# Patient Record
Sex: Male | Born: 1935 | Race: White | Marital: Married | Smoking: Former smoker
Health system: Southern US, Community
[De-identification: ages and names within clinical notes are randomized; demographics above are authoritative.]

## PROBLEM LIST (undated history)

## (undated) DIAGNOSIS — F329 Major depressive disorder, single episode, unspecified: Secondary | ICD-10-CM

## (undated) DIAGNOSIS — G4700 Insomnia, unspecified: Secondary | ICD-10-CM

## (undated) DIAGNOSIS — E039 Hypothyroidism, unspecified: Secondary | ICD-10-CM

## (undated) DIAGNOSIS — G309 Alzheimer's disease, unspecified: Principal | ICD-10-CM

## (undated) DIAGNOSIS — F0280 Dementia in other diseases classified elsewhere without behavioral disturbance: Secondary | ICD-10-CM

## (undated) DIAGNOSIS — H9190 Unspecified hearing loss, unspecified ear: Secondary | ICD-10-CM

## (undated) DIAGNOSIS — E079 Disorder of thyroid, unspecified: Secondary | ICD-10-CM

## (undated) DIAGNOSIS — F32A Depression, unspecified: Secondary | ICD-10-CM

## (undated) HISTORY — DX: Depression, unspecified: F32.A

## (undated) HISTORY — DX: Unspecified hearing loss, unspecified ear: H91.90

## (undated) HISTORY — PX: APPENDECTOMY: SHX54

## (undated) HISTORY — DX: Insomnia, unspecified: G47.00

## (undated) HISTORY — DX: Major depressive disorder, single episode, unspecified: F32.9

## (undated) HISTORY — DX: Hypothyroidism, unspecified: E03.9

## (undated) HISTORY — DX: Disorder of thyroid, unspecified: E07.9

## (undated) HISTORY — DX: Alzheimer's disease, unspecified: G30.9

## (undated) HISTORY — DX: Dementia in other diseases classified elsewhere, unspecified severity, without behavioral disturbance, psychotic disturbance, mood disturbance, and anxiety: F02.80

---

## 2010-11-09 DIAGNOSIS — F0280 Dementia in other diseases classified elsewhere without behavioral disturbance: Secondary | ICD-10-CM | POA: Insufficient documentation

## 2010-11-09 DIAGNOSIS — F32A Depression, unspecified: Secondary | ICD-10-CM | POA: Insufficient documentation

## 2010-11-09 DIAGNOSIS — E039 Hypothyroidism, unspecified: Secondary | ICD-10-CM | POA: Insufficient documentation

## 2010-11-09 DIAGNOSIS — F329 Major depressive disorder, single episode, unspecified: Secondary | ICD-10-CM

## 2010-11-09 DIAGNOSIS — G4700 Insomnia, unspecified: Secondary | ICD-10-CM | POA: Insufficient documentation

## 2012-11-13 ENCOUNTER — Ambulatory Visit (INDEPENDENT_AMBULATORY_CARE_PROVIDER_SITE_OTHER): Payer: Medicare Other | Admitting: Family Medicine

## 2012-11-13 ENCOUNTER — Ambulatory Visit: Payer: Self-pay | Admitting: Family Medicine

## 2012-11-13 ENCOUNTER — Encounter: Payer: Self-pay | Admitting: Family Medicine

## 2012-11-13 VITALS — BP 116/66 | HR 65 | Temp 97.0°F | Ht 65.0 in | Wt 140.0 lb

## 2012-11-13 DIAGNOSIS — G309 Alzheimer's disease, unspecified: Secondary | ICD-10-CM

## 2012-11-13 DIAGNOSIS — E039 Hypothyroidism, unspecified: Secondary | ICD-10-CM

## 2012-11-13 DIAGNOSIS — F329 Major depressive disorder, single episode, unspecified: Secondary | ICD-10-CM

## 2012-11-13 DIAGNOSIS — F3289 Other specified depressive episodes: Secondary | ICD-10-CM

## 2012-11-13 DIAGNOSIS — G4700 Insomnia, unspecified: Secondary | ICD-10-CM

## 2012-11-13 DIAGNOSIS — F32A Depression, unspecified: Secondary | ICD-10-CM

## 2012-11-13 DIAGNOSIS — F0280 Dementia in other diseases classified elsewhere without behavioral disturbance: Secondary | ICD-10-CM

## 2012-11-13 LAB — COMPLETE METABOLIC PANEL WITH GFR
ALT: 14 U/L (ref 0–53)
AST: 22 U/L (ref 0–37)
Albumin: 4.2 g/dL (ref 3.5–5.2)
Alkaline Phosphatase: 60 U/L (ref 39–117)
BUN: 19 mg/dL (ref 6–23)
CO2: 28 mEq/L (ref 19–32)
Calcium: 9.5 mg/dL (ref 8.4–10.5)
Chloride: 103 mEq/L (ref 96–112)
Creat: 1.4 mg/dL — ABNORMAL HIGH (ref 0.50–1.35)
GFR, Est African American: 54 mL/min — ABNORMAL LOW
GFR, Est Non African American: 47 mL/min — ABNORMAL LOW
Glucose, Bld: 89 mg/dL (ref 70–99)
Potassium: 4.4 mEq/L (ref 3.5–5.3)
Sodium: 139 mEq/L (ref 135–145)
Total Bilirubin: 0.5 mg/dL (ref 0.3–1.2)
Total Protein: 6.9 g/dL (ref 6.0–8.3)

## 2012-11-13 LAB — TSH: TSH: 2.527 u[IU]/mL (ref 0.350–4.500)

## 2012-11-13 MED ORDER — TRAZODONE HCL 50 MG PO TABS: 50.0000 mg | ORAL_TABLET | Freq: Every day | ORAL | Status: DC

## 2012-11-13 MED ORDER — CITALOPRAM HYDROBROMIDE 20 MG PO TABS: 20.0000 mg | ORAL_TABLET | Freq: Every day | ORAL | Status: DC

## 2012-11-13 MED ORDER — LEVOTHYROXINE SODIUM 50 MCG PO TABS: 50.0000 ug | ORAL_TABLET | Freq: Every day | ORAL | Status: DC

## 2012-11-13 MED ORDER — DONEPEZIL HCL 10 MG PO TABS: 10.0000 mg | ORAL_TABLET | Freq: Every day | ORAL | Status: DC

## 2012-11-13 NOTE — Assessment & Plan Note (Signed)
Doing well on the Celexa. No dose changes today.

## 2012-11-13 NOTE — Assessment & Plan Note (Signed)
Stable , and doing well. Memory is  Stable. Continue present dose of Aricept.

## 2012-11-13 NOTE — Progress Notes (Signed)
Patient ID: Jeremy Bailey, male   DOB: 1928/12/27, 79 y.o.   MRN: [id number] SUBJECTIVE:   HPI: Patient comes in for routine followup. They usually come every 3 months. It's been 6 months since his last visit. Medical problems are: Alzheimer's disease. He is normal with Aricept no change. He's been stable. Insomnia: He sleeps well with the trazodone. Hypothyroidism: The dosage was increased last time the did not come in in time to repeat check the TSH. depression : The Celexa works very well for him.    PMH/PSH: reviewed/updated in Epic  SH/FH: reviewed/updated in Epic  Allergies: reviewed/updated in Epic  Medications: reviewed/updated in Epic  Immunizations: reviewed/updated in Epic    ROS: No new complaints. He keeps active, he does a lot of outdoor yard work, even mows the grass. No complaints doing this.  OBJECTIVE:    On examination he appeared in good health and spirits. No distress. Anicteric, Acyanotic. Slim built. Quiet and pleasant. Vital signs as documented. BP 116/66  Pulse 65  Temp(Src) 97 F (36.1 C) (Oral)  Ht 5\' 5"  (1.651 m)  Wt 140 lb (63.504 kg)  BMI 23.3 kg/m2  Skin warm and dry and without overt rashes.  Head,Ears,Eyes,Throat: normal Neck without JVD.  Lungs clear.  Heart exam notable for regular rhythm, normal sounds and absence of murmurs, rubs or gallops. Abdomen unremarkable and without evidence of organomegaly, masses, or abdominal aortic enlargement. GU: Extremities nonedematous. Neurologic:nonfocal and extremities. Aware of himself, person, and place. Short-term memory poor.  ASSESSMENT:  Alzheimer disease Stable , and doing well. Memory is  Stable. Continue present dose of Aricept.  Insomnia Doing well on trazodone. No change in dose.  Hypothyroid His levothyroxine was increased to 50 mcg. We will check a TSH today and dosage adjustment as necessary.  Depression Doing well on the Celexa. No dose changes today.    PLAN:  Orders  Placed This Encounter  Procedures  . TSH  . COMPLETE METABOLIC PANEL WITH GFR                               Meds ordered this encounter  Medications  . citalopram (CELEXA) 20 MG tablet    Sig: Take 1 tablet (20 mg total) by mouth daily.    Dispense:  90 tablet    Refill:  3  . DISCONTD: donepezil (ARICEPT) 10 MG tablet    Sig: Take 1 tablet (10 mg total) by mouth at bedtime.    Dispense:  90 tablet    Refill:  3  . levothyroxine (SYNTHROID, LEVOTHROID) 50 MCG tablet    Sig: Take 1 tablet (50 mcg total) by mouth daily.    Dispense:  90 tablet    Refill:  3  . traZODone (DESYREL) 50 MG tablet    Sig: Take 1 tablet (50 mg total) by mouth at bedtime.    Dispense:  90 tablet    Refill:  3  . donepezil (ARICEPT) 10 MG tablet    Sig: Take 1 tablet (10 mg total) by mouth at bedtime.    Dispense:  90 tablet    Refill:  3   refilled her medications for mail order written prescription given. Continued same level home clear. His wife accompanies him today. Patient is still functional. And keeps active. Healthy and balance meals. Supervised activities. Return to clinic in 3 months for routine followup. Or before as needed.  Phyllis Whitefield P. [place] Charon, M.D.

## 2012-11-13 NOTE — Assessment & Plan Note (Signed)
His levothyroxine was increased to 50 mcg. We will check a TSH today and dosage adjustment as necessary.

## 2012-11-13 NOTE — Assessment & Plan Note (Signed)
Doing well on trazodone. No change in dose.

## 2012-11-14 NOTE — Progress Notes (Signed)
Quick Note:  Lab result at goal. No change in Medications for now. ______ 

## 2012-12-04 ENCOUNTER — Encounter: Payer: Self-pay | Admitting: *Deleted

## 2013-02-12 ENCOUNTER — Ambulatory Visit: Payer: Medicare Other | Admitting: Family Medicine

## 2013-02-17 ENCOUNTER — Encounter: Payer: Self-pay | Admitting: Family Medicine

## 2013-02-17 ENCOUNTER — Telehealth: Payer: Self-pay | Admitting: Family Medicine

## 2013-02-17 ENCOUNTER — Ambulatory Visit (INDEPENDENT_AMBULATORY_CARE_PROVIDER_SITE_OTHER): Payer: Medicare Other | Admitting: Family Medicine

## 2013-02-17 VITALS — BP 121/72 | HR 54 | Temp 97.4°F | Ht 65.0 in | Wt 132.8 lb

## 2013-02-17 DIAGNOSIS — F0280 Dementia in other diseases classified elsewhere without behavioral disturbance: Secondary | ICD-10-CM

## 2013-02-17 DIAGNOSIS — G309 Alzheimer's disease, unspecified: Secondary | ICD-10-CM

## 2013-02-17 DIAGNOSIS — D492 Neoplasm of unspecified behavior of bone, soft tissue, and skin: Secondary | ICD-10-CM | POA: Insufficient documentation

## 2013-02-17 DIAGNOSIS — R630 Anorexia: Secondary | ICD-10-CM | POA: Insufficient documentation

## 2013-02-17 DIAGNOSIS — G4700 Insomnia, unspecified: Secondary | ICD-10-CM

## 2013-02-17 DIAGNOSIS — F3289 Other specified depressive episodes: Secondary | ICD-10-CM

## 2013-02-17 DIAGNOSIS — E039 Hypothyroidism, unspecified: Secondary | ICD-10-CM

## 2013-02-17 DIAGNOSIS — F32A Depression, unspecified: Secondary | ICD-10-CM

## 2013-02-17 DIAGNOSIS — R634 Abnormal weight loss: Secondary | ICD-10-CM | POA: Insufficient documentation

## 2013-02-17 DIAGNOSIS — F329 Major depressive disorder, single episode, unspecified: Secondary | ICD-10-CM

## 2013-02-17 MED ORDER — TRAZODONE HCL 50 MG PO TABS: 50.0000 mg | ORAL_TABLET | Freq: Every day | ORAL | Status: DC

## 2013-02-17 MED ORDER — CITALOPRAM HYDROBROMIDE 20 MG PO TABS: 20.0000 mg | ORAL_TABLET | Freq: Every day | ORAL | Status: DC

## 2013-02-17 MED ORDER — DONEPEZIL HCL 10 MG PO TABS: 10.0000 mg | ORAL_TABLET | Freq: Every day | ORAL | Status: DC

## 2013-02-17 MED ORDER — LEVOTHYROXINE SODIUM 50 MCG PO TABS: 50.0000 ug | ORAL_TABLET | Freq: Every day | ORAL | Status: DC

## 2013-02-17 MED ORDER — DONEPEZIL HCL 10 MG PO TABS: 10.0000 mg | ORAL_TABLET | Freq: Every day | ORAL | Status: AC

## 2013-02-17 MED ORDER — MEMANTINE HCL 28 X 5 MG & 21 X 10 MG PO TABS: ORAL_TABLET | ORAL | Status: DC

## 2013-02-17 MED ORDER — MEGESTROL ACETATE 40 MG PO TABS
40.0000 mg | ORAL_TABLET | Freq: Four times a day (QID) | ORAL | Status: DC
Start: 2013-02-17 — End: 2013-02-18

## 2013-02-17 NOTE — Progress Notes (Signed)
Patient ID: Jeremy Bailey, male   DOB: 1928/10/11, 79 y.o.   MRN: [id number] SUBJECTIVE: CC: Chief Complaint  Patient presents with  . Follow-up    3 lmonth follow up  refills  fasting. wife states not eating well. losing weight  \ HPI: Here for follow up of his dementia. His memory continues to deteriorate a little bit but he is doing well. He has no appetite or interest to eat. He does love  Ice cream only. Patient here with his wife for follow up. His  Wife is the caretaker, and  Requests something to help with appetite. She does not want to put him through any workup that may put her husband through unnnecessary pain and procedures and feels the focus should be on maintaining memeory as best as possible if the medications will work, and try to maintain his  Health with medications. He does not  feel any pain. He says he feels fine. He  recognizes that his memory is  Extremely poor.  Past Medical History  Diagnosis Date  . Alzheimer disease   . Thyroid disease   . Depression   . Insomnia   . Hypothyroid   . Insomnia    Past Surgical History  Procedure Laterality Date  . Appendectomy     History   Social History  . Marital Status: Married    Spouse Name: N/A    Number of Children: N/A  . Years of Education: N/A   Occupational History  . Not on file.   Social History Main Topics  . Smoking status: Former Smoker -- 1.50 packs/day for 33 years    Start date: 08/14/1945    Quit date: 08/14/1978  . Smokeless tobacco: Not on file  . Alcohol Use: Not on file  . Drug Use: Not on file  . Sexually Active: Not on file   Other Topics Concern  . Not on file   Social History Narrative  . No narrative on file   No family history on file. No current outpatient prescriptions on file prior to visit.   No current facility-administered medications on file prior to visit.   No Known Allergies Immunization History  Administered Date(s) Administered  . Influenza Whole 04/26/2010    Prior to Admission medications   Medication Sig Start Date End Date Taking? Authorizing Provider  citalopram (CELEXA) 20 MG tablet Take 1 tablet (20 mg total) by mouth daily. 02/17/13  Yes Ileana Ladd, MD  donepezil (ARICEPT) 10 MG tablet Take 1 tablet (10 mg total) by mouth at bedtime. 02/17/13  Yes Ileana Ladd, MD  levothyroxine (SYNTHROID, LEVOTHROID) 50 MCG tablet Take 1 tablet (50 mcg total) by mouth daily. 02/17/13  Yes Ileana Ladd, MD  Multiple Vitamin (MULTIVITAMIN) tablet Take 1 tablet by mouth daily.   Yes Historical Provider, MD  traZODone (DESYREL) 50 MG tablet Take 1 tablet (50 mg total) by mouth at bedtime. 02/17/13  Yes Ileana Ladd, MD  megestrol (MEGACE) 40 MG tablet Take 1 tablet (40 mg total) by mouth 4 (four) times daily. 02/17/13   Ileana Ladd, MD  memantine [facility] TITRATION PAK) tablet pack 5 mg/day for =1 week; 5 mg twice daily for =1 week; 15 mg/day given in 5 mg and 10 mg separated doses for =1 week; then 10 mg twice daily 02/17/13   Ileana Ladd, MD    ROS: As above in the HPI. All other systems are stable or negative.  OBJECTIVE: APPEARANCE:  Patient in  no acute distress.The patient appeared well nourished and normally developed. Acyanotic. Waist: VITAL SIGNS:BP 121/72  Pulse 54  Temp(Src) 97.4 F (36.3 C) (Oral)  Ht 5\' 5"  (1.651 m)  Wt 132 lb 12.8 oz (60.238 kg)  BMI 22.1 kg/m2 Thin WM pleasant  SKIN: warm and  Dry without overt rashes, tattoos and scars. He has a 1 cm lesion on the right forehead with a central ulcer and raised margins.  HEAD and Neck: without JVD, Head and scalp: normal Eyes:No scleral icterus. Fundi normal, eye movements normal. Ears: Auricle normal, canal normal, Tympanic membranes normal, insufflation normal. Nose: normal Throat: normal Neck & thyroid: normal  CHEST & LUNGS: Chest wall: normal Lungs: Clear  CVS: Reveals the PMI to be normally located. Regular rhythm, First and Second Heart sounds are normal,   1/6 Diastolic murmur at the Aortic  Area. no rubs nor gallops. Peripheral vasculature: Radial pulses: normal  ABDOMEN:  Appearance: normal Benign, no organomegaly, no masses, no Abdominal Aortic enlargement. No Guarding , no rebound. No Bruits. Bowel sounds: normal  RECTAL: N/A GU: N/A  EXTREMETIES: nonedematous. . MUSCULOSKELETAL:  Spine: normal Joints: intact  NEUROLOGIC: oriented to place and person; nonfocal in extremities. Forgot what he  Did yesterday or what he ate.  ASSESSMENT: Alzheimer disease - Plan: memantine (NAMENDA TITRATION PAK) tablet pack, donepezil (ARICEPT) 10 MG tablet, DISCONTINUED: donepezil (ARICEPT) 10 MG tablet  Insomnia - Plan: traZODone (DESYREL) 50 MG tablet, DISCONTINUED: traZODone (DESYREL) 50 MG tablet  Hypothyroid - Plan: TSH, levothyroxine (SYNTHROID, LEVOTHROID) 50 MCG tablet, DISCONTINUED: levothyroxine (SYNTHROID, LEVOTHROID) 50 MCG tablet  Depression - Plan: citalopram (CELEXA) 20 MG tablet, DISCONTINUED: citalopram (CELEXA) 20 MG tablet  Neoplasm of skin of face - Plan: Ambulatory referral to Dermatology  Weight loss - Plan: TSH, COMPLETE METABOLIC PANEL WITH GFR  Anorexia - Plan: megestrol (MEGACE) 40 MG tablet, TSH, COMPLETE METABOLIC PANEL WITH GFR  PLAN: Orders Placed This Encounter  Procedures  . TSH  . COMPLETE METABOLIC PANEL WITH GFR  . Ambulatory referral to Dermatology    Referral Priority:  Routine    Referral Type:  Consultation    Referral Reason:  Specialty Services Required    Requested Specialty:  Dermatology    Number of Visits Requested:  1   Meds ordered this encounter  Medications  . Multiple Vitamin (MULTIVITAMIN) tablet    Sig: Take 1 tablet by mouth daily.  . megestrol (MEGACE) 40 MG tablet    Sig: Take 1 tablet (40 mg total) by mouth 4 (four) times daily.    Dispense:  40 tablet    Refill:  0  . memantine (NAMENDA TITRATION PAK) tablet pack    Sig: 5 mg/day for =1 week; 5 mg twice daily for =1  week; 15 mg/day given in 5 mg and 10 mg separated doses for =1 week; then 10 mg twice daily    Dispense:  49 tablet    Refill:  12  . DISCONTD: donepezil (ARICEPT) 10 MG tablet    Sig: Take 1 tablet (10 mg total) by mouth at bedtime.    Dispense:  90 tablet    Refill:  3  . DISCONTD: citalopram (CELEXA) 20 MG tablet    Sig: Take 1 tablet (20 mg total) by mouth daily.    Dispense:  90 tablet    Refill:  3  . DISCONTD: levothyroxine (SYNTHROID, LEVOTHROID) 50 MCG tablet    Sig: Take 1 tablet (50 mcg total) by mouth daily.    Dispense:  90 tablet    Refill:  3  . DISCONTD: traZODone (DESYREL) 50 MG tablet    Sig: Take 1 tablet (50 mg total) by mouth at bedtime.    Dispense:  90 tablet    Refill:  3  . levothyroxine (SYNTHROID, LEVOTHROID) 50 MCG tablet    Sig: Take 1 tablet (50 mcg total) by mouth daily.    Dispense:  90 tablet    Refill:  3  . donepezil (ARICEPT) 10 MG tablet    Sig: Take 1 tablet (10 mg total) by mouth at bedtime.    Dispense:  90 tablet    Refill:  3  . citalopram (CELEXA) 20 MG tablet    Sig: Take 1 tablet (20 mg total) by mouth daily.    Dispense:  90 tablet    Refill:  3  . traZODone (DESYREL) 50 MG tablet    Sig: Take 1 tablet (50 mg total) by mouth at bedtime.    Dispense:  90 tablet    Refill:  3   Discussed with his  Wife the limits for workup and treatment and agreed to the above medication adjustment. Trial of adding namenda, and trial of megace.  Return in about 4 weeks (around 03/17/2013) for Recheck medical problems. Recheck weight and namenda and will need new Rx of namenda as his dose is increased.  Discussed  Diet, and using BOOST or ENSURE. Also making food shakes with ice  Cream since the ice  Cream is his favorite.   Ladona Rosten P. [place] Charon, M.D.

## 2013-02-18 ENCOUNTER — Other Ambulatory Visit: Payer: Self-pay | Admitting: Family Medicine

## 2013-02-18 DIAGNOSIS — F0280 Dementia in other diseases classified elsewhere without behavioral disturbance: Secondary | ICD-10-CM

## 2013-02-18 DIAGNOSIS — G309 Alzheimer's disease, unspecified: Secondary | ICD-10-CM

## 2013-02-18 DIAGNOSIS — R630 Anorexia: Secondary | ICD-10-CM

## 2013-02-18 LAB — COMPLETE METABOLIC PANEL WITH GFR
ALT: 10 U/L (ref 0–53)
AST: 22 U/L (ref 0–37)
Albumin: 4.7 g/dL (ref 3.5–5.2)
Alkaline Phosphatase: 63 U/L (ref 39–117)
BUN: 28 mg/dL — ABNORMAL HIGH (ref 6–23)
CO2: 30 mEq/L (ref 19–32)
Calcium: 10.1 mg/dL (ref 8.4–10.5)
Chloride: 98 mEq/L (ref 96–112)
Creat: 1.74 mg/dL — ABNORMAL HIGH (ref 0.50–1.35)
GFR, Est African American: 41 mL/min — ABNORMAL LOW
GFR, Est Non African American: 36 mL/min — ABNORMAL LOW
Glucose, Bld: 90 mg/dL (ref 70–99)
Potassium: 4.5 mEq/L (ref 3.5–5.3)
Sodium: 136 mEq/L (ref 135–145)
Total Bilirubin: 0.7 mg/dL (ref 0.3–1.2)
Total Protein: 7.5 g/dL (ref 6.0–8.3)

## 2013-02-18 LAB — TSH: TSH: 2.939 u[IU]/mL (ref 0.350–4.500)

## 2013-02-18 MED ORDER — MEGESTROL ACETATE 40 MG PO TABS: 40.0000 mg | ORAL_TABLET | Freq: Four times a day (QID) | ORAL | Status: DC

## 2013-02-18 MED ORDER — MEMANTINE HCL 28 X 5 MG & 21 X 10 MG PO TABS: ORAL_TABLET | ORAL | Status: DC

## 2013-02-18 NOTE — Telephone Encounter (Signed)
Done during visit

## 2013-02-18 NOTE — Progress Notes (Signed)
Quick Note:  Labs abnormal. BUN and creatinine Elevated due to inadequate fluid intake. Patient has alzheimer's disease and not taking enough fluids and eating. Needs to take in at least 2 liters of fluid a day. Thyroid level is fine. Rest of labs okay.  ______

## 2013-03-21 ENCOUNTER — Encounter: Payer: Self-pay | Admitting: Family Medicine

## 2013-03-21 ENCOUNTER — Ambulatory Visit (INDEPENDENT_AMBULATORY_CARE_PROVIDER_SITE_OTHER): Payer: Medicare Other | Admitting: Family Medicine

## 2013-03-21 VITALS — BP 119/67 | HR 58 | Temp 97.0°F | Wt 132.2 lb

## 2013-03-21 DIAGNOSIS — G309 Alzheimer's disease, unspecified: Secondary | ICD-10-CM

## 2013-03-21 DIAGNOSIS — G4700 Insomnia, unspecified: Secondary | ICD-10-CM

## 2013-03-21 DIAGNOSIS — F32A Depression, unspecified: Secondary | ICD-10-CM

## 2013-03-21 DIAGNOSIS — F0280 Dementia in other diseases classified elsewhere without behavioral disturbance: Secondary | ICD-10-CM

## 2013-03-21 DIAGNOSIS — F329 Major depressive disorder, single episode, unspecified: Secondary | ICD-10-CM

## 2013-03-21 DIAGNOSIS — F3289 Other specified depressive episodes: Secondary | ICD-10-CM

## 2013-03-21 DIAGNOSIS — R630 Anorexia: Secondary | ICD-10-CM

## 2013-03-21 DIAGNOSIS — R634 Abnormal weight loss: Secondary | ICD-10-CM

## 2013-03-21 DIAGNOSIS — D492 Neoplasm of unspecified behavior of bone, soft tissue, and skin: Secondary | ICD-10-CM

## 2013-03-21 DIAGNOSIS — E039 Hypothyroidism, unspecified: Secondary | ICD-10-CM

## 2013-03-21 MED ORDER — CYPROHEPTADINE HCL 4 MG PO TABS: 4.0000 mg | ORAL_TABLET | Freq: Three times a day (TID) | ORAL | Status: DC | PRN

## 2013-03-21 MED ORDER — MEMANTINE HCL 10 MG PO TABS: 10.0000 mg | ORAL_TABLET | Freq: Two times a day (BID) | ORAL | Status: DC

## 2013-03-21 NOTE — Progress Notes (Signed)
Patient ID: Jeremy Bailey, male   DOB: [birth year], 79 y.o.   MRN: [id number] SUBJECTIVE: CC: Chief Complaint  Patient presents with  . Follow-up    4 week follow up losing weight  states only eats breakfast but will eat ice cream     HPI: Here for follow up of anorexia, dementia/alzheimer's disease; and neoplasm of the forehead. The Dermatologist has removed the lesion on the right forehead and awaiting the pathology. Patient tolerated the namenda and the wife feels it is okay to go ahead with a Rx. Could not get the megace to improve his appetite. Would like something else.  Past Medical History  Diagnosis Date  . Alzheimer disease   . Thyroid disease   . Depression   . Insomnia   . Hypothyroid   . Insomnia    Past Surgical History  Procedure Laterality Date  . Appendectomy     History   Social History  . Marital Status: Married    Spouse Name: N/A    Number of Children: N/A  . Years of Education: N/A   Occupational History  . Not on file.   Social History Main Topics  . Smoking status: Former Smoker -- 1.50 packs/day for 33 years    Start date: 08/14/1945    Quit date: 08/14/1978  . Smokeless tobacco: Not on file  . Alcohol Use: Not on file  . Drug Use: Not on file  . Sexually Active: Not on file   Other Topics Concern  . Not on file   Social History Narrative  . No narrative on file   No family history on file. Current Outpatient Prescriptions on File Prior to Visit  Medication Sig Dispense Refill  . citalopram (CELEXA) 20 MG tablet Take 1 tablet (20 mg total) by mouth daily.  90 tablet  3  . donepezil (ARICEPT) 10 MG tablet Take 1 tablet (10 mg total) by mouth at bedtime.  90 tablet  3  . levothyroxine (SYNTHROID, LEVOTHROID) 50 MCG tablet Take 1 tablet (50 mcg total) by mouth daily.  90 tablet  3  . Multiple Vitamin (MULTIVITAMIN) tablet Take 1 tablet by mouth daily.      . traZODone (DESYREL) 50 MG tablet Take 1 tablet (50 mg total) by mouth at bedtime.   90 tablet  3  . megestrol (MEGACE) 40 MG tablet Take 1 tablet (40 mg total) by mouth 4 (four) times daily.  40 tablet  0   No current facility-administered medications on file prior to visit.   No Known Allergies Immunization History  Administered Date(s) Administered  . Influenza Whole 04/26/2010   Prior to Admission medications   Medication Sig Start Date End Date Taking? Authorizing Provider  citalopram (CELEXA) 20 MG tablet Take 1 tablet (20 mg total) by mouth daily. 02/17/13  Yes Ileana Ladd, MD  donepezil (ARICEPT) 10 MG tablet Take 1 tablet (10 mg total) by mouth at bedtime. 02/17/13  Yes Ileana Ladd, MD  levothyroxine (SYNTHROID, LEVOTHROID) 50 MCG tablet Take 1 tablet (50 mcg total) by mouth daily. 02/17/13  Yes Ileana Ladd, MD  Multiple Vitamin (MULTIVITAMIN) tablet Take 1 tablet by mouth daily.   Yes Historical Provider, MD  traZODone (DESYREL) 50 MG tablet Take 1 tablet (50 mg total) by mouth at bedtime. 02/17/13  Yes Ileana Ladd, MD  cyproheptadine (PERIACTIN) 4 MG tablet Take 1 tablet (4 mg total) by mouth 3 (three) times daily as needed. 03/21/13   Ileana Ladd, MD  megestrol (MEGACE) 40 MG tablet Take 1 tablet (40 mg total) by mouth 4 (four) times daily. 02/18/13   Ileana Ladd, MD  memantine (NAMENDA) 10 MG tablet Take 1 tablet (10 mg total) by mouth 2 (two) times daily. 03/21/13   Ileana Ladd, MD     ROS: As above in the HPI. All other systems are stable or negative.  OBJECTIVE: APPEARANCE:  Patient in no acute distress.The patient appeared well nourished and normally developed. Acyanotic. Waist: VITAL SIGNS:BP 119/67  Pulse 58  Temp(Src) 97 F (36.1 C) (Oral)  Wt 132 lb 3.2 oz (59.966 kg)  BMI 22 kg/m2 Pleasant slim WM   SKIN: warm and  Dry without overt rashes, tattoos.Post excision of the large skin neoplasm of the right forehead.  HEAD and Neck: without JVD, Head and scalp: normal Eyes:No scleral icterus. Fundi normal, eye movements  normal. Ears: Auricle normal, canal normal, Tympanic membranes normal, insufflation normal. Nose: normal Throat: normal Neck & thyroid: normal  CHEST & LUNGS: Chest wall: normal Lungs: Clear  CVS: Reveals the PMI to be normally located. Regular rhythm, First and Second Heart sounds are normal,  absence of murmurs, rubs or gallops. Peripheral vasculature: Radial pulses: normal Dorsal pedis pulses: normal Posterior pulses: normal  ABDOMEN:  Appearance: normal Benign, no organomegaly, no masses, no Abdominal Aortic enlargement. No Guarding , no rebound. No Bruits. Bowel sounds: normal  RECTAL: N/A GU: N/A  EXTREMETIES: nonedematous.  NEUROLOGIC: oriented to person only; nonfocal in the extremities  Results for orders placed in visit on 02/17/13  TSH      Result Value Range   TSH 2.939  0.350 - 4.500 uIU/mL  COMPLETE METABOLIC PANEL WITH GFR      Result Value Range   Sodium 136  135 - 145 mEq/L   Potassium 4.5  3.5 - 5.3 mEq/L   Chloride 98  96 - 112 mEq/L   CO2 30  19 - 32 mEq/L   Glucose, Bld 90  70 - 99 mg/dL   BUN 28 (*) 6 - 23 mg/dL   Creat 4.78 (*) 2.95 - 1.35 mg/dL   Total Bilirubin 0.7  0.3 - 1.2 mg/dL   Alkaline Phosphatase 63  39 - 117 U/L   AST 22  0 - 37 U/L   ALT 10  0 - 53 U/L   Total Protein 7.5  6.0 - 8.3 g/dL   Albumin 4.7  3.5 - 5.2 g/dL   Calcium 62.1  8.4 - 30.8 mg/dL   GFR, Est African American 41 (*)    GFR, Est Non African American 36 (*)     ASSESSMENT: Alzheimer disease - Plan: memantine (NAMENDA) 10 MG tablet  Neoplasm of skin of face  Depression  Insomnia  Hypothyroid  Anorexia - Plan: cyproheptadine (PERIACTIN) 4 MG tablet  Weight loss  PLAN: Meds ordered this encounter  Medications  . memantine (NAMENDA) 10 MG tablet    Sig: Take 1 tablet (10 mg total) by mouth 2 (two) times daily.    Dispense:  180 tablet    Refill:  5  . cyproheptadine (PERIACTIN) 4 MG tablet    Sig: Take 1 tablet (4 mg total) by mouth 3 (three)  times daily as needed.    Dispense:  90 tablet    Refill:  1    Labs reviewed with wife. We will hope that the cyproheptadine works in improving his appetite.  Return in about 6 weeks (around 05/02/2013) for Recheck medical problems. and  weight.  Yulia Ulrich P. [place] Charon, M.D.

## 2013-04-10 ENCOUNTER — Other Ambulatory Visit: Payer: Self-pay

## 2013-04-10 DIAGNOSIS — R630 Anorexia: Secondary | ICD-10-CM

## 2013-04-10 MED ORDER — MEGESTROL ACETATE 40 MG PO TABS: 40.0000 mg | ORAL_TABLET | Freq: Four times a day (QID) | ORAL | Status: DC

## 2013-04-10 NOTE — Telephone Encounter (Signed)
Rx ready for pick up. 

## 2013-04-10 NOTE — Telephone Encounter (Signed)
Last seen 03/21/13   [facility]  IF approved print for mail order

## 2013-04-15 ENCOUNTER — Other Ambulatory Visit: Payer: Self-pay | Admitting: Family Medicine

## 2013-04-15 DIAGNOSIS — R630 Anorexia: Secondary | ICD-10-CM

## 2013-04-29 ENCOUNTER — Encounter: Payer: Self-pay | Admitting: Family Medicine

## 2013-04-29 ENCOUNTER — Telehealth: Payer: Self-pay | Admitting: Family Medicine

## 2013-04-29 ENCOUNTER — Ambulatory Visit (INDEPENDENT_AMBULATORY_CARE_PROVIDER_SITE_OTHER): Payer: Medicare Other | Admitting: Family Medicine

## 2013-04-29 VITALS — BP 112/65 | HR 63 | Temp 97.5°F | Ht 65.0 in | Wt 138.8 lb

## 2013-04-29 DIAGNOSIS — R58 Hemorrhage, not elsewhere classified: Secondary | ICD-10-CM | POA: Insufficient documentation

## 2013-04-29 DIAGNOSIS — IMO0002 Reserved for concepts with insufficient information to code with codable children: Secondary | ICD-10-CM

## 2013-04-29 DIAGNOSIS — E039 Hypothyroidism, unspecified: Secondary | ICD-10-CM

## 2013-04-29 DIAGNOSIS — F0280 Dementia in other diseases classified elsewhere without behavioral disturbance: Secondary | ICD-10-CM

## 2013-04-29 DIAGNOSIS — S73004A Unspecified dislocation of right hip, initial encounter: Secondary | ICD-10-CM | POA: Insufficient documentation

## 2013-04-29 DIAGNOSIS — I998 Other disorder of circulatory system: Secondary | ICD-10-CM

## 2013-04-29 DIAGNOSIS — G309 Alzheimer's disease, unspecified: Secondary | ICD-10-CM

## 2013-04-29 DIAGNOSIS — D62 Acute posthemorrhagic anemia: Secondary | ICD-10-CM

## 2013-04-29 DIAGNOSIS — S73004S Unspecified dislocation of right hip, sequela: Secondary | ICD-10-CM

## 2013-04-29 LAB — POCT CBC
Granulocyte percent: 66.7 %G (ref 37–80)
HCT, POC: 29.6 % — AB (ref 43.5–53.7)
Hemoglobin: 10.2 g/dL — AB (ref 14.1–18.1)
Lymph, poc: 1.9 (ref 0.6–3.4)
MCH, POC: 30 pg (ref 27–31.2)
MCHC: 34.4 g/dL (ref 31.8–35.4)
MCV: 87.4 fL (ref 80–97)
MPV: 7.2 fL (ref 0–99.8)
POC Granulocyte: 4.7 (ref 2–6.9)
POC LYMPH PERCENT: 26.5 %L (ref 10–50)
Platelet Count, POC: 214 10*3/uL (ref 142–424)
RBC: 3.4 M/uL — AB (ref 4.69–6.13)
RDW, POC: 15.1 %
WBC: 7.1 10*3/uL (ref 4.6–10.2)

## 2013-04-29 NOTE — Telephone Encounter (Signed)
appt scheduled

## 2013-04-29 NOTE — Progress Notes (Signed)
Patient ID: Jeremy Bailey, male   DOB: 1928-11-13, 79 y.o.   MRN: [id number] SUBJECTIVE: CC: Chief Complaint  Patient presents with  . Acute Visit    urinary tract infection went to [facility]   . Bleeding/Bruising    large bruise right leg was not bruised on saturday he mowed yard yest with riding mower    HPI: Was in bed and a few days ago the right hip dislocated with movement. He was in pain and the daughter came into the bed to look and saw the right hip bulging forward and she gentle pushed it back in.she felt it pop back in. The area was bruised, he was taken to the [facility] and had Xrays and evaluation and discharged home from the ED. The daughter noticed that his right posterior thigh and the right calf was all bruised.he is ambulatory without pain.  Past Medical History  Diagnosis Date  . Alzheimer disease   . Thyroid disease   . Depression   . Insomnia   . Hypothyroid   . Insomnia    Past Surgical History  Procedure Laterality Date  . Appendectomy     History   Social History  . Marital Status: Married    Spouse Name: N/A    Number of Children: N/A  . Years of Education: N/A   Occupational History  . Not on file.   Social History Main Topics  . Smoking status: Former Smoker -- 1.50 packs/day for 33 years    Start date: 08/14/1945    Quit date: 08/14/1978  . Smokeless tobacco: Not on file  . Alcohol Use: Not on file  . Drug Use: Not on file  . Sexual Activity: Not on file   Other Topics Concern  . Not on file   Social History Narrative  . No narrative on file   No family history on file. Current Outpatient Prescriptions on File Prior to Visit  Medication Sig Dispense Refill  . citalopram (CELEXA) 20 MG tablet Take 1 tablet (20 mg total) by mouth daily.  90 tablet  3  . donepezil (ARICEPT) 10 MG tablet Take 1 tablet (10 mg total) by mouth at bedtime.  90 tablet  3  . levothyroxine (SYNTHROID, LEVOTHROID) 50 MCG tablet Take 1 tablet (50  mcg total) by mouth daily.  90 tablet  3  . memantine (NAMENDA) 10 MG tablet Take 1 tablet (10 mg total) by mouth 2 (two) times daily.  180 tablet  5  . Multiple Vitamin (MULTIVITAMIN) tablet Take 1 tablet by mouth daily.      . traZODone (DESYREL) 50 MG tablet Take 1 tablet (50 mg total) by mouth at bedtime.  90 tablet  3  . cyproheptadine (PERIACTIN) 4 MG tablet Take 1 tablet (4 mg total) by mouth 3 (three) times daily as needed.  90 tablet  1  . megestrol (MEGACE) 40 MG tablet Take 1 tablet (40 mg total) by mouth 4 (four) times daily.  360 tablet  0   No current facility-administered medications on file prior to visit.   No Known Allergies Immunization History  Administered Date(s) Administered  . Influenza Whole 04/26/2010   Prior to Admission medications   Medication Sig Start Date End Date Taking? Authorizing Provider  ciprofloxacin (CIPRO) 500 MG tablet 500 mg. Take 1 tablet (500 mg total) by mouth 2 (two) times daily. 04/26/13 05/03/13 Yes Historical Provider, MD  citalopram (CELEXA) 20 MG tablet Take 1 tablet (20 mg total) by mouth  daily. 02/17/13  Yes Ileana Ladd, MD  donepezil (ARICEPT) 10 MG tablet Take 1 tablet (10 mg total) by mouth at bedtime. 02/17/13  Yes Ileana Ladd, MD  ibuprofen (ADVIL,MOTRIN) 600 MG tablet 600 mg. Take 1 tablet (600 mg total) by mouth every 8 (eight) hours as needed for Pain. 04/26/13 05/01/13 Yes Historical Provider, MD  levothyroxine (SYNTHROID, LEVOTHROID) 50 MCG tablet Take 1 tablet (50 mcg total) by mouth daily. 02/17/13  Yes Ileana Ladd, MD  memantine (NAMENDA) 10 MG tablet Take 1 tablet (10 mg total) by mouth 2 (two) times daily. 03/21/13  Yes Ileana Ladd, MD  Multiple Vitamin (MULTIVITAMIN) tablet Take 1 tablet by mouth daily.   Yes Historical Provider, MD  traZODone (DESYREL) 50 MG tablet Take 1 tablet (50 mg total) by mouth at bedtime. 02/17/13  Yes Ileana Ladd, MD  acetaminophen (TYLENOL) 325 MG tablet Take by mouth as needed.    Historical  Provider, MD  cyproheptadine (PERIACTIN) 4 MG tablet Take 1 tablet (4 mg total) by mouth 3 (three) times daily as needed. 03/21/13   Ileana Ladd, MD  megestrol (MEGACE) 40 MG tablet Take 1 tablet (40 mg total) by mouth 4 (four) times daily. 04/10/13   Ileana Ladd, MD     ROS: As above in the HPI. All other systems are stable or negative.  OBJECTIVE: APPEARANCE:  Patient in no acute distress.The patient appeared well nourished and normally developed. Acyanotic. Waist: VITAL SIGNS:BP 112/65  Pulse 63  Temp(Src) 97.5 F (36.4 C) (Oral)  Ht 5\' 5"  (1.651 m)  Wt 138 lb 12.8 oz (62.959 kg)  BMI 23.1 kg/m2 WM Pleasant soft spoken, reserved  SKIN: warm and  Dry without overt rashes, tattoos and scars  HEAD and Neck: without JVD, Head and scalp: normal Eyes:No scleral icterus. Fundi normal, eye movements normal. Ears: Auricle normal, canal normal, Tympanic membranes normal, insufflation normal. Nose: normal Throat: normal Neck & thyroid: normal  CHEST & LUNGS: Chest wall: normal Lungs: Clear  CVS: Reveals the PMI to be normally located. Regular rhythm, First and Second Heart sounds are normal,  absence of murmurs, rubs or gallops. Peripheral vasculature: Radial pulses: normal Dorsal pedis pulses: normal Posterior pulses: normal  ABDOMEN:  Appearance: normal Benign, no organomegaly, no masses, no Abdominal Aortic enlargement. No Guarding , no rebound. No Bruits. Bowel sounds: normal  RECTAL: N/A GU: N/A  EXTREMETIES: nonedematous.  MUSCULOSKELETAL:  Spine: normal Joints: right hip lateral swelling with ecchymosis. There is old ecchymosis quite extensive from the back of the thigh down to the inferior end of the belly of the calf. There is swelling in the tissue  NEUROLOGIC: oriented to person; nonfocal in extremities.  ASSESSMENT: Ecchymosis - Plan: POCT CBC, POCT CBC  Hip dislocation, right, sequela  Acute blood loss anemia  Alzheimer  disease  Hypothyroid   PLAN: Reviewed the Xrays of the hip and pelvis in Care Everywhere: no fractures. Reviewed the notes from [facility] in [place]. Orders Placed This Encounter  Procedures  . POCT CBC  . POCT CBC    Standing Status: Future     Number of Occurrences:      Standing Expiration Date: 05/29/2013    Results for orders placed in visit on 04/29/13  POCT CBC      Result Value Range   WBC 7.1  4.6 - 10.2 K/uL   Lymph, poc 1.9  0.6 - 3.4   POC LYMPH PERCENT 26.5  10 - 50 %L  MID (cbc)    0 - 0.9   POC MID %    0 - 12 %M   POC Granulocyte 4.7  2 - 6.9   Granulocyte percent 66.7  37 - 80 %G   RBC 3.4 (*) 4.69 - 6.13 M/uL   Hemoglobin 10.2 (*) 14.1 - 18.1 g/dL   HCT, POC 16.1 (*) 09.6 - 53.7 %   MCV 87.4  80 - 97 fL   MCH, POC 30.0  27 - 31.2 pg   MCHC 34.4  31.8 - 35.4 g/dL   RDW, POC 04.5     Platelet Count, POC 214.0  142 - 424 K/uL   MPV 7.2  0 - 99.8 fL    Ice Rest elevate. Not heat. Discussed with daughter that the event must have traumatically cause bleeding into the thigh with a 1 point drop in his hemoglobin by the pooling of blood into the thigh.   Return in about 3 days (around 05/02/2013) for Recheck medical problems.and recheck the CBC  Gladie Gravette P. [place] Charon, M.D.

## 2013-05-01 ENCOUNTER — Ambulatory Visit (INDEPENDENT_AMBULATORY_CARE_PROVIDER_SITE_OTHER): Payer: Medicare Other | Admitting: Family Medicine

## 2013-05-01 ENCOUNTER — Encounter: Payer: Self-pay | Admitting: Family Medicine

## 2013-05-01 VITALS — BP 115/63 | HR 53 | Temp 97.1°F | Wt 137.8 lb

## 2013-05-01 DIAGNOSIS — I998 Other disorder of circulatory system: Secondary | ICD-10-CM

## 2013-05-01 DIAGNOSIS — S7011XS Contusion of right thigh, sequela: Secondary | ICD-10-CM

## 2013-05-01 DIAGNOSIS — R58 Hemorrhage, not elsewhere classified: Secondary | ICD-10-CM

## 2013-05-01 DIAGNOSIS — IMO0002 Reserved for concepts with insufficient information to code with codable children: Secondary | ICD-10-CM

## 2013-05-01 DIAGNOSIS — S7001XS Contusion of right hip, sequela: Secondary | ICD-10-CM

## 2013-05-01 LAB — POCT CBC
Granulocyte percent: 66.4 %G (ref 37–80)
HCT, POC: 29.8 % — AB (ref 43.5–53.7)
Hemoglobin: 10.3 g/dL — AB (ref 14.1–18.1)
Lymph, poc: 1.8 (ref 0.6–3.4)
MCH, POC: 29.9 pg (ref 27–31.2)
MCHC: 34.4 g/dL (ref 31.8–35.4)
MCV: 86.9 fL (ref 80–97)
MPV: 7.5 fL (ref 0–99.8)
POC Granulocyte: 4.9 (ref 2–6.9)
POC LYMPH PERCENT: 24.3 %L (ref 10–50)
Platelet Count, POC: 220 10*3/uL (ref 142–424)
RBC: 3.4 M/uL — AB (ref 4.69–6.13)
RDW, POC: 15.2 %
WBC: 7.4 10*3/uL (ref 4.6–10.2)

## 2013-05-01 NOTE — Progress Notes (Signed)
Patient ID: Jeremy Bailey, male   DOB: 1928/10/08, 79 y.o.   MRN: [id number] SUBJECTIVE: CC: Chief Complaint  Patient presents with  . Follow-up    reck CBC     HPI: Doing okay. Ambulating but the hip is sore if he walks a lot. The bruising is fading a little in the calf and back of the right thigh  Past Medical History  Diagnosis Date  . Alzheimer disease   . Thyroid disease   . Depression   . Insomnia   . Hypothyroid   . Insomnia    Past Surgical History  Procedure Laterality Date  . Appendectomy     History   Social History  . Marital Status: Married    Spouse Name: N/A    Number of Children: N/A  . Years of Education: N/A   Occupational History  . Not on file.   Social History Main Topics  . Smoking status: Former Smoker -- 1.50 packs/day for 33 years    Start date: 08/14/1945    Quit date: 08/14/1978  . Smokeless tobacco: Not on file  . Alcohol Use: Not on file  . Drug Use: Not on file  . Sexual Activity: Not on file   Other Topics Concern  . Not on file   Social History Narrative  . No narrative on file   No family history on file. Current Outpatient Prescriptions on File Prior to Visit  Medication Sig Dispense Refill  . acetaminophen (TYLENOL) 325 MG tablet Take by mouth as needed.      . ciprofloxacin (CIPRO) 500 MG tablet 500 mg. Take 1 tablet (500 mg total) by mouth 2 (two) times daily.      . citalopram (CELEXA) 20 MG tablet Take 1 tablet (20 mg total) by mouth daily.  90 tablet  3  . cyproheptadine (PERIACTIN) 4 MG tablet Take 1 tablet (4 mg total) by mouth 3 (three) times daily as needed.  90 tablet  1  . donepezil (ARICEPT) 10 MG tablet Take 1 tablet (10 mg total) by mouth at bedtime.  90 tablet  3  . ibuprofen (ADVIL,MOTRIN) 600 MG tablet 600 mg. Take 1 tablet (600 mg total) by mouth every 8 (eight) hours as needed for Pain.      Marland Kitchen levothyroxine (SYNTHROID, LEVOTHROID) 50 MCG tablet Take 1 tablet (50 mcg total) by mouth daily.  90 tablet  3  .  memantine (NAMENDA) 10 MG tablet Take 1 tablet (10 mg total) by mouth 2 (two) times daily.  180 tablet  5  . Multiple Vitamin (MULTIVITAMIN) tablet Take 1 tablet by mouth daily.      . traZODone (DESYREL) 50 MG tablet Take 1 tablet (50 mg total) by mouth at bedtime.  90 tablet  3   No current facility-administered medications on file prior to visit.   No Known Allergies Immunization History  Administered Date(s) Administered  . Influenza Whole 04/26/2010   Prior to Admission medications   Medication Sig Start Date End Date Taking? Authorizing Provider  acetaminophen (TYLENOL) 325 MG tablet Take by mouth as needed.    Historical Provider, MD  ciprofloxacin (CIPRO) 500 MG tablet 500 mg. Take 1 tablet (500 mg total) by mouth 2 (two) times daily. 04/26/13 05/03/13  Historical Provider, MD  citalopram (CELEXA) 20 MG tablet Take 1 tablet (20 mg total) by mouth daily. 02/17/13   Ileana Ladd, MD  cyproheptadine (PERIACTIN) 4 MG tablet Take 1 tablet (4 mg total) by mouth 3 (three) times  daily as needed. 03/21/13   Ileana Ladd, MD  donepezil (ARICEPT) 10 MG tablet Take 1 tablet (10 mg total) by mouth at bedtime. 02/17/13   Ileana Ladd, MD  ibuprofen (ADVIL,MOTRIN) 600 MG tablet 600 mg. Take 1 tablet (600 mg total) by mouth every 8 (eight) hours as needed for Pain. 04/26/13 05/01/13  Historical Provider, MD  levothyroxine (SYNTHROID, LEVOTHROID) 50 MCG tablet Take 1 tablet (50 mcg total) by mouth daily. 02/17/13   Ileana Ladd, MD  memantine (NAMENDA) 10 MG tablet Take 1 tablet (10 mg total) by mouth 2 (two) times daily. 03/21/13   Ileana Ladd, MD  Multiple Vitamin (MULTIVITAMIN) tablet Take 1 tablet by mouth daily.    Historical Provider, MD  traZODone (DESYREL) 50 MG tablet Take 1 tablet (50 mg total) by mouth at bedtime. 02/17/13   Ileana Ladd, MD     ROS: As above in the HPI. All other systems are stable or negative.  OBJECTIVE: APPEARANCE:  Patient in no acute distress.The patient appeared  well nourished and normally developed. Acyanotic. Waist: VITAL SIGNS:BP 115/63  Pulse 53  Temp(Src) 97.1 F (36.2 C) (Oral)  Wt 137 lb 12.8 oz (62.506 kg)  BMI 22.93 kg/m2 Elderly WM  SKIN: warm and  Dry without overt rashes, tattoos and scars  HEAD and Neck: without JVD, Head and scalp: normal Eyes:No scleral icterus. Fundi normal, eye movements normal. Ears: Auricle normal, canal normal, Tympanic membranes normal, insufflation normal. Nose: normal Throat: normal Neck & thyroid: normal  CHEST & LUNGS: Chest wall: normal Lungs: Clear  CVS: Reveals the PMI to be normally located. Regular rhythm, First and Second Heart sounds are normal,  absence of murmurs, rubs or gallops.  ABDOMEN:  Appearance: normal Benign, no organomegaly, no masses, no Abdominal Aortic enlargement. No Guarding , no rebound. No Bruits. Bowel sounds: normal  RECTAL: N/A GU: N/A  EXTREMETIES: nonedematous.  MUSCULOSKELETAL:  Joints:right hip has a large hematoma, with reduced ROM and mild tenderness.  NEUROLOGIC: oriented to place and person; nonfocal in extremities, but patient has no recollection of any trauma or events.  ASSESSMENT: Ecchymosis - Plan: POCT CBC, CT Hip Right Wo Contrast  Contusion of hip and thigh, right, sequela - Plan: CT Hip Right Wo Contrast   PLAN: I suspect that he fell out of bed and may still have an undiagnosed impacted fracture.  Orders Placed This Encounter  Procedures  . CT Hip Right Wo Contrast    Standing Status: Future     Number of Occurrences:      Standing Expiration Date: 07/31/2014    Order Specific Question:  Reason for Exam (SYMPTOM  OR DIAGNOSIS REQUIRED)    Answer:  dementia. probably fell, dislocated hip and reduced , suspect to have impacted fracture    Order Specific Question:  Preferred imaging location?    Answer:  [facility]   Results for orders placed in visit on 05/01/13  POCT CBC      Result Value Range   WBC 7.4  4.6 -  10.2 K/uL   Lymph, poc 1.8  0.6 - 3.4   POC LYMPH PERCENT 24.3  10 - 50 %L   POC Granulocyte 4.9  2 - 6.9   Granulocyte percent 66.4  37 - 80 %G   RBC 3.4 (*) 4.69 - 6.13 M/uL   Hemoglobin 10.3 (*) 14.1 - 18.1 g/dL   HCT, POC 16.1 (*) 09.6 - 53.7 %   MCV 86.9  80 -  97 fL   MCH, POC 29.9  27 - 31.2 pg   MCHC 34.4  31.8 - 35.4 g/dL   RDW, POC 47.8     Platelet Count, POC 220.0  142 - 424 K/uL   MPV 7.5  0 - 99.8 fL   Hemoglobin stable.  Ice rest await the CT.  Return in about 2 weeks (around 05/15/2013) for Recheck medical problems.   Mccartney Brucks P. [place] Charon, M.D.

## 2013-05-02 ENCOUNTER — Ambulatory Visit: Payer: Medicare Other | Admitting: Family Medicine

## 2013-05-02 ENCOUNTER — Telehealth: Payer: Self-pay | Admitting: Family Medicine

## 2013-05-06 ENCOUNTER — Other Ambulatory Visit: Payer: Self-pay

## 2013-05-06 ENCOUNTER — Ambulatory Visit (INDEPENDENT_AMBULATORY_CARE_PROVIDER_SITE_OTHER): Payer: Medicare Other | Admitting: Family Medicine

## 2013-05-06 ENCOUNTER — Ambulatory Visit (HOSPITAL_COMMUNITY)
Admission: RE | Admit: 2013-05-06 | Discharge: 2013-05-06 | Disposition: A | Discharge status: Home / Self Care | Payer: Medicare Other | Source: Ambulatory Visit | Attending: Family Medicine | Admitting: Family Medicine

## 2013-05-06 ENCOUNTER — Encounter: Payer: Self-pay | Admitting: Family Medicine

## 2013-05-06 VITALS — BP 133/69 | HR 95 | Temp 97.8°F | Ht 65.0 in | Wt 135.6 lb

## 2013-05-06 DIAGNOSIS — X58XXXA Exposure to other specified factors, initial encounter: Secondary | ICD-10-CM | POA: Insufficient documentation

## 2013-05-06 DIAGNOSIS — I998 Other disorder of circulatory system: Secondary | ICD-10-CM

## 2013-05-06 DIAGNOSIS — R634 Abnormal weight loss: Secondary | ICD-10-CM

## 2013-05-06 DIAGNOSIS — S7000XA Contusion of unspecified hip, initial encounter: Secondary | ICD-10-CM | POA: Insufficient documentation

## 2013-05-06 DIAGNOSIS — IMO0002 Reserved for concepts with insufficient information to code with codable children: Secondary | ICD-10-CM

## 2013-05-06 DIAGNOSIS — E039 Hypothyroidism, unspecified: Secondary | ICD-10-CM

## 2013-05-06 DIAGNOSIS — D62 Acute posthemorrhagic anemia: Secondary | ICD-10-CM

## 2013-05-06 DIAGNOSIS — S7001XS Contusion of right hip, sequela: Secondary | ICD-10-CM

## 2013-05-06 DIAGNOSIS — F0280 Dementia in other diseases classified elsewhere without behavioral disturbance: Secondary | ICD-10-CM

## 2013-05-06 DIAGNOSIS — R58 Hemorrhage, not elsewhere classified: Secondary | ICD-10-CM

## 2013-05-06 DIAGNOSIS — S73004S Unspecified dislocation of right hip, sequela: Secondary | ICD-10-CM

## 2013-05-06 DIAGNOSIS — R630 Anorexia: Secondary | ICD-10-CM

## 2013-05-06 DIAGNOSIS — G309 Alzheimer's disease, unspecified: Secondary | ICD-10-CM

## 2013-05-06 LAB — POCT CBC
Granulocyte percent: 66.4 %G (ref 37–80)
HCT, POC: 34 % — AB (ref 43.5–53.7)
Hemoglobin: 11.5 g/dL — AB (ref 14.1–18.1)
Lymph, poc: 1.8 (ref 0.6–3.4)
MCH, POC: 29.5 pg (ref 27–31.2)
MCHC: 33.8 g/dL (ref 31.8–35.4)
MCV: 87.3 fL (ref 80–97)
MPV: 7.2 fL (ref 0–99.8)
POC Granulocyte: 4.5 (ref 2–6.9)
POC LYMPH PERCENT: 26.2 %L (ref 10–50)
Platelet Count, POC: 261 10*3/uL (ref 142–424)
RBC: 3.9 M/uL — AB (ref 4.69–6.13)
RDW, POC: 15.2 %
WBC: 6.8 10*3/uL (ref 4.6–10.2)

## 2013-05-06 MED ORDER — ACETAMINOPHEN-CODEINE #3 300-30 MG PO TABS: 1.0000 | ORAL_TABLET | Freq: Every day | ORAL | Status: DC

## 2013-05-06 NOTE — Progress Notes (Signed)
Patient ID: Jeremy Bailey, male   DOB: 03-10-1929, 79 y.o.   MRN: [id number] SUBJECTIVE: CC: Chief Complaint  Patient presents with  . Follow-up    6 week follow up  daughter states apptetite poor and she wants MRI     HPI:  Has pain in the hip amd difficulty ambulating. Uses Ibuprofen in the am, needs something for pain at night. Limps. Hasn't heard about the scan for the hip.   Past Medical History  Diagnosis Date  . Alzheimer disease   . Thyroid disease   . Depression   . Insomnia   . Hypothyroid   . Insomnia    Past Surgical History  Procedure Laterality Date  . Appendectomy     History   Social History  . Marital Status: Married    Spouse Name: N/A    Number of Children: N/A  . Years of Education: N/A   Occupational History  . Not on file.   Social History Main Topics  . Smoking status: Former Smoker -- 1.50 packs/day for 33 years    Start date: 08/14/1945    Quit date: 08/14/1978  . Smokeless tobacco: Not on file  . Alcohol Use: Not on file  . Drug Use: Not on file  . Sexual Activity: Not on file   Other Topics Concern  . Not on file   Social History Narrative  . No narrative on file   No family history on file. Current Outpatient Prescriptions on File Prior to Visit  Medication Sig Dispense Refill  . acetaminophen (TYLENOL) 325 MG tablet Take by mouth as needed.      . citalopram (CELEXA) 20 MG tablet Take 1 tablet (20 mg total) by mouth daily.  90 tablet  3  . cyproheptadine (PERIACTIN) 4 MG tablet Take 1 tablet (4 mg total) by mouth 3 (three) times daily as needed.  90 tablet  1  . donepezil (ARICEPT) 10 MG tablet Take 1 tablet (10 mg total) by mouth at bedtime.  90 tablet  3  . levothyroxine (SYNTHROID, LEVOTHROID) 50 MCG tablet Take 1 tablet (50 mcg total) by mouth daily.  90 tablet  3  . memantine (NAMENDA) 10 MG tablet Take 1 tablet (10 mg total) by mouth 2 (two) times daily.  180 tablet  5  . Multiple Vitamin (MULTIVITAMIN) tablet Take 1 tablet  by mouth daily.      . traZODone (DESYREL) 50 MG tablet Take 1 tablet (50 mg total) by mouth at bedtime.  90 tablet  3   No current facility-administered medications on file prior to visit.   No Known Allergies Immunization History  Administered Date(s) Administered  . Influenza Whole 04/26/2010   Prior to Admission medications   Medication Sig Start Date End Date Taking? Authorizing Provider  acetaminophen (TYLENOL) 325 MG tablet Take by mouth as needed.   Yes Historical Provider, MD  citalopram (CELEXA) 20 MG tablet Take 1 tablet (20 mg total) by mouth daily. 02/17/13  Yes Ileana Ladd, MD  cyproheptadine (PERIACTIN) 4 MG tablet Take 1 tablet (4 mg total) by mouth 3 (three) times daily as needed. 03/21/13  Yes Ileana Ladd, MD  donepezil (ARICEPT) 10 MG tablet Take 1 tablet (10 mg total) by mouth at bedtime. 02/17/13  Yes Ileana Ladd, MD  levothyroxine (SYNTHROID, LEVOTHROID) 50 MCG tablet Take 1 tablet (50 mcg total) by mouth daily. 02/17/13  Yes Ileana Ladd, MD  memantine (NAMENDA) 10 MG tablet Take 1 tablet (10 mg  total) by mouth 2 (two) times daily. 03/21/13  Yes Ileana Ladd, MD  Multiple Vitamin (MULTIVITAMIN) tablet Take 1 tablet by mouth daily.   Yes Historical Provider, MD  traZODone (DESYREL) 50 MG tablet Take 1 tablet (50 mg total) by mouth at bedtime. 02/17/13  Yes Ileana Ladd, MD     ROS: As above in the HPI. All other systems are stable or negative.  OBJECTIVE: APPEARANCE:  Patient in no acute distress.The patient appeared well nourished and normally developed. Acyanotic. Waist: VITAL SIGNS:BP 133/69  Pulse 95  Temp(Src) 97.8 F (36.6 C) (Oral)  Ht 5\' 5"  (1.651 m)  Wt 135 lb 9.6 oz (61.508 kg)  BMI 22.57 kg/m2 WM quiet  SKIN: warm and  Dry without overt rashes, tattoos and scars  HEAD and Neck: without JVD, Head and scalp: normal Eyes:No scleral icterus. Fundi normal, eye movements normal. Ears: Auricle normal, canal normal, Tympanic membranes normal,  insufflation normal. Nose: normal Throat: normal Neck & thyroid: normal  CHEST & LUNGS: Chest wall: normal Lungs: Clear  CVS: Reveals the PMI to be normally located. Regular rhythm, First and Second Heart sounds are normal,  absence of murmurs, rubs or gallops. Peripheral vasculature: Radial pulses: normal Dorsal pedis pulses: normal Posterior pulses: normal  ABDOMEN:  Appearance: normal Benign, no organomegaly, no masses, no Abdominal Aortic enlargement. No Guarding , no rebound. No Bruits. Bowel sounds: normal  RECTAL: N/A GU: N/A  EXTREMETIES: nonedematous. Ecchymosis of the right hip and the right lower thigh and calf is fading.   MUSCULOSKELETAL:  Spine: normal Joints: right hip has reduced ROM. Limps.  NEUROLOGIC: oriented to person; nonfocal. Poor memory. l.  ASSESSMENT: Alzheimer disease  Hypothyroid  Anorexia - Plan: BMP8+EGFR  Acute blood loss anemia - Plan: POCT CBC  Ecchymosis  Hip dislocation, right, sequela  Weight loss   PLAN: Ct of the hip ordered for today await that result Negative report called for fracture not seen. Arthritisprsent  Orders Placed This Encounter  Procedures  . BMP8+EGFR  . POCT CBC    Results for orders placed in visit on 05/06/13  POCT CBC      Result Value Range   WBC 6.8  4.6 - 10.2 K/uL   Lymph, poc 1.8  0.6 - 3.4   POC LYMPH PERCENT 26.2  10 - 50 %L   POC Granulocyte 4.5  2 - 6.9   Granulocyte percent 66.4  37 - 80 %G   RBC 3.9 (*) 4.69 - 6.13 M/uL   Hemoglobin 11.5 (*) 14.1 - 18.1 g/dL   HCT, POC 16.1 (*) 09.6 - 53.7 %   MCV 87.3  80 - 97 fL   MCH, POC 29.5  27 - 31.2 pg   MCHC 33.8  31.8 - 35.4 g/dL   RDW, POC 04.5     Platelet Count, POC 261.0  142 - 424 K/uL   MPV 7.2  0 - 99.8 fL    Ice. Analgesics. Meds ordered this encounter  Medications  . acetaminophen-codeine (TYLENOL #3) 300-30 MG per tablet    Sig: Take 1 tablet by mouth at bedtime.    Dispense:  20 tablet    Refill:  0    Keep the follow up next week  Jsaon Yoo P. [place] Charon, M.D.

## 2013-05-06 NOTE — Telephone Encounter (Signed)
Pt here today with daughter and will address need for mri on rt thigh

## 2013-05-07 ENCOUNTER — Telehealth: Payer: Self-pay | Admitting: Family Medicine

## 2013-05-07 LAB — BMP8+EGFR
BUN/Creatinine Ratio: 17 (ref 10–22)
BUN: 38 mg/dL — ABNORMAL HIGH (ref 8–27)
CO2: 25 mmol/L (ref 18–29)
Calcium: 9.7 mg/dL (ref 8.6–10.2)
Chloride: 101 mmol/L (ref 97–108)
Creatinine, Ser: 2.18 mg/dL — ABNORMAL HIGH (ref 0.76–1.27)
GFR calc Af Amer: 31 mL/min/{1.73_m2} — ABNORMAL LOW (ref >59)
GFR calc non Af Amer: 27 mL/min/{1.73_m2} — ABNORMAL LOW (ref >59)
Glucose: 99 mg/dL (ref 65–99)
Potassium: 4.2 mmol/L (ref 3.5–5.2)
Sodium: 141 mmol/L (ref 134–144)

## 2013-05-07 NOTE — Progress Notes (Signed)
Quick Note:  Labs abnormal. Reflects dehydration.push oral fluids / juices/gatorade. Minimum 2 L/24 hours. If not taking in fluids will need IV at ED. ______

## 2013-05-08 NOTE — Telephone Encounter (Signed)
Spoke with Larita Fife and she states she gave him the tylenol # 3 and stayed up all time and she did not give him anymore and she is giving him motrin and doing ok and she will contac [place] if he needs other meds.

## 2013-05-21 ENCOUNTER — Ambulatory Visit (INDEPENDENT_AMBULATORY_CARE_PROVIDER_SITE_OTHER): Payer: Medicare Other

## 2013-05-21 ENCOUNTER — Ambulatory Visit: Payer: Medicare Other

## 2013-05-21 ENCOUNTER — Encounter (INDEPENDENT_AMBULATORY_CARE_PROVIDER_SITE_OTHER): Payer: Self-pay

## 2013-05-21 DIAGNOSIS — Z23 Encounter for immunization: Secondary | ICD-10-CM

## 2013-05-23 ENCOUNTER — Ambulatory Visit (INDEPENDENT_AMBULATORY_CARE_PROVIDER_SITE_OTHER): Payer: Medicare Other | Admitting: Family Medicine

## 2013-05-23 ENCOUNTER — Encounter: Payer: Self-pay | Admitting: Family Medicine

## 2013-05-23 VITALS — BP 127/77 | HR 61 | Temp 97.9°F | Ht 65.0 in | Wt 132.4 lb

## 2013-05-23 DIAGNOSIS — D492 Neoplasm of unspecified behavior of bone, soft tissue, and skin: Secondary | ICD-10-CM

## 2013-05-23 DIAGNOSIS — D62 Acute posthemorrhagic anemia: Secondary | ICD-10-CM

## 2013-05-23 DIAGNOSIS — R634 Abnormal weight loss: Secondary | ICD-10-CM

## 2013-05-23 DIAGNOSIS — F329 Major depressive disorder, single episode, unspecified: Secondary | ICD-10-CM

## 2013-05-23 DIAGNOSIS — G4700 Insomnia, unspecified: Secondary | ICD-10-CM

## 2013-05-23 DIAGNOSIS — IMO0002 Reserved for concepts with insufficient information to code with codable children: Secondary | ICD-10-CM

## 2013-05-23 DIAGNOSIS — F3289 Other specified depressive episodes: Secondary | ICD-10-CM

## 2013-05-23 DIAGNOSIS — G309 Alzheimer's disease, unspecified: Secondary | ICD-10-CM

## 2013-05-23 DIAGNOSIS — F32A Depression, unspecified: Secondary | ICD-10-CM

## 2013-05-23 DIAGNOSIS — F0280 Dementia in other diseases classified elsewhere without behavioral disturbance: Secondary | ICD-10-CM

## 2013-05-23 DIAGNOSIS — S73004S Unspecified dislocation of right hip, sequela: Secondary | ICD-10-CM

## 2013-05-23 DIAGNOSIS — E039 Hypothyroidism, unspecified: Secondary | ICD-10-CM

## 2013-05-23 LAB — POCT CBC
Granulocyte percent: 67.3 %G (ref 37–80)
HCT, POC: 36.7 % — AB (ref 43.5–53.7)
Hemoglobin: 12.4 g/dL — AB (ref 14.1–18.1)
Lymph, poc: 1.8 (ref 0.6–3.4)
MCH, POC: 29.6 pg (ref 27–31.2)
MCHC: 33.7 g/dL (ref 31.8–35.4)
MCV: 87.9 fL (ref 80–97)
MPV: 7.6 fL (ref 0–99.8)
POC Granulocyte: 5 (ref 2–6.9)
POC LYMPH PERCENT: 23.8 %L (ref 10–50)
Platelet Count, POC: 238 10*3/uL (ref 142–424)
RBC: 4.2 M/uL — AB (ref 4.69–6.13)
RDW, POC: 14.7 %
WBC: 7.4 10*3/uL (ref 4.6–10.2)

## 2013-05-23 MED ORDER — ACETAMINOPHEN-CODEINE #3 300-30 MG PO TABS: 1.0000 | ORAL_TABLET | Freq: Every day | ORAL | Status: AC

## 2013-05-23 NOTE — Progress Notes (Signed)
Patient ID: Jeremy Bailey, male   DOB: 13-Jun-1929, 79 y.o.   MRN: [id number] SUBJECTIVE: CC:  HPI: Here for follow up of his right hip hematoma and dislocation. Problem resolved, but when he tries to sleep at night. His right hip hurt.  His memory continues to deteriorate slowly.    Past Medical History  Diagnosis Date  . Alzheimer disease   . Thyroid disease   . Depression   . Insomnia   . Hypothyroid   . Insomnia    Past Surgical History  Procedure Laterality Date  . Appendectomy     History   Social History  . Marital Status: Married    Spouse Name: N/A    Number of Children: N/A  . Years of Education: N/A   Occupational History  . Not on file.   Social History Main Topics  . Smoking status: Former Smoker -- 1.50 packs/day for 33 years    Start date: 08/14/1945    Quit date: 08/14/1978  . Smokeless tobacco: Not on file  . Alcohol Use: Not on file  . Drug Use: Not on file  . Sexual Activity: Not on file   Other Topics Concern  . Not on file   Social History Narrative  . No narrative on file   No family history on file. Current Outpatient Prescriptions on File Prior to Visit  Medication Sig Dispense Refill  . acetaminophen (TYLENOL) 325 MG tablet Take by mouth as needed.      Marland Kitchen acetaminophen-codeine (TYLENOL #3) 300-30 MG per tablet Take 1 tablet by mouth at bedtime.  20 tablet  0  . citalopram (CELEXA) 20 MG tablet Take 1 tablet (20 mg total) by mouth daily.  90 tablet  3  . cyproheptadine (PERIACTIN) 4 MG tablet Take 1 tablet (4 mg total) by mouth 3 (three) times daily as needed.  90 tablet  1  . donepezil (ARICEPT) 10 MG tablet Take 1 tablet (10 mg total) by mouth at bedtime.  90 tablet  3  . levothyroxine (SYNTHROID, LEVOTHROID) 50 MCG tablet Take 1 tablet (50 mcg total) by mouth daily.  90 tablet  3  . memantine (NAMENDA) 10 MG tablet Take 1 tablet (10 mg total) by mouth 2 (two) times daily.  180 tablet  5  . Multiple Vitamin (MULTIVITAMIN) tablet Take 1  tablet by mouth daily.      . traZODone (DESYREL) 50 MG tablet Take 1 tablet (50 mg total) by mouth at bedtime.  90 tablet  3   No current facility-administered medications on file prior to visit.   No Known Allergies Immunization History  Administered Date(s) Administered  . Influenza Whole 04/26/2010  . Influenza,inj,Quad PF,36+ Mos 05/21/2013   Prior to Admission medications   Medication Sig Start Date End Date Taking? Authorizing Provider  acetaminophen (TYLENOL) 325 MG tablet Take by mouth as needed.    Historical Provider, MD  acetaminophen-codeine (TYLENOL #3) 300-30 MG per tablet Take 1 tablet by mouth at bedtime. 05/06/13   Ileana Ladd, MD  citalopram (CELEXA) 20 MG tablet Take 1 tablet (20 mg total) by mouth daily. 02/17/13   Ileana Ladd, MD  cyproheptadine (PERIACTIN) 4 MG tablet Take 1 tablet (4 mg total) by mouth 3 (three) times daily as needed. 03/21/13   Ileana Ladd, MD  donepezil (ARICEPT) 10 MG tablet Take 1 tablet (10 mg total) by mouth at bedtime. 02/17/13   Ileana Ladd, MD  levothyroxine (SYNTHROID, LEVOTHROID) 50 MCG tablet Take 1  tablet (50 mcg total) by mouth daily. 02/17/13   Ileana Ladd, MD  memantine (NAMENDA) 10 MG tablet Take 1 tablet (10 mg total) by mouth 2 (two) times daily. 03/21/13   Ileana Ladd, MD  Multiple Vitamin (MULTIVITAMIN) tablet Take 1 tablet by mouth daily.    Historical Provider, MD  traZODone (DESYREL) 50 MG tablet Take 1 tablet (50 mg total) by mouth at bedtime. 02/17/13   Ileana Ladd, MD    ROS: As above in the HPI. All other systems are stable or negative.  OBJECTIVE: APPEARANCE:  Patient in no acute distress.The patient appeared well nourished and normally developed. Acyanotic. Waist: VITAL SIGNS:BP 127/77  Pulse 61  Temp(Src) 97.9 F (36.6 C) (Oral)  Ht 5\' 5"  (1.651 m)  Wt 132 lb 6.4 oz (60.056 kg)  BMI 22.03 kg/m2 WM Slim built quiet.  SKIN: warm and  Dry without overt rashes, tattoos and scars. Ecchymosis and   swellling of the right hip has resolved.  HEAD and Neck: without JVD, Head and scalp: normal Eyes:No scleral icterus. Fundi normal, eye movements normal. Ears: Auricle normal, canal normal, Tympanic membranes normal, insufflation normal. Nose: normal Throat: normal Neck & thyroid: normal  CHEST & LUNGS: Chest wall: normal Lungs: Clear  CVS: Reveals the PMI to be normally located. Regular rhythm, First and Second Heart sounds are normal,  absence of murmurs, rubs or gallops. Peripheral vasculature: Radial pulses: normal Dorsal pedis pulses: normal Posterior pulses: normal  ABDOMEN:  Appearance: normal Benign, no organomegaly, no masses, no Abdominal Aortic enlargement. No Guarding , no rebound. No Bruits. Bowel sounds: normal  RECTAL: N/A GU: N/A  EXTREMETIES: nonedematous.  MUSCULOSKELETAL:  Spine: normal Joints: right hip reduced ROM.  NEUROLOGIC: oriented to person; nonfocal in extremities.  ASSESSMENT: Hip dislocation, right, sequela - Plan: acetaminophen-codeine (TYLENOL #3) 300-30 MG per tablet  Alzheimer disease - Plan: BMP8+EGFR  Neoplasm of skin of face  Depression - Plan: BMP8+EGFR  Hypothyroid  Insomnia  Acute blood loss anemia - Plan: POCT CBC  Weight loss - Plan: BMP8+EGFR  Resolved right hip injury having chronic pain from the arthritis. Progressive dementia Not much else to offer. Family would like more of a comfort care.  PLAN: Orders Placed This Encounter  Procedures  . BMP8+EGFR  . POCT CBC    Meds ordered this encounter  Medications  . acetaminophen-codeine (TYLENOL #3) 300-30 MG per tablet    Sig: Take 1 tablet by mouth at bedtime.    Dispense:  30 tablet    Refill:  0    Results for orders placed in visit on 05/06/13  [facility]      Result Value Range   Glucose 99  65 - 99 mg/dL   BUN 38 (*) 8 - 27 mg/dL   Creatinine, Ser 1.61 (*) 0.76 - 1.27 mg/dL   GFR calc non Af Amer 27 (*) >59 mL/min/1.73   GFR calc Af Amer 31  (*) >59 mL/min/1.73   BUN/Creatinine Ratio 17  10 - 22   Sodium 141  134 - 144 mmol/L   Potassium 4.2  3.5 - 5.2 mmol/L   Chloride 101  97 - 108 mmol/L   CO2 25  18 - 29 mmol/L   Calcium 9.7  8.6 - 10.2 mg/dL  POCT CBC      Result Value Range   WBC 6.8  4.6 - 10.2 K/uL   Lymph, poc 1.8  0.6 - 3.4   POC LYMPH PERCENT 26.2  10 -  50 %L   POC Granulocyte 4.5  2 - 6.9   Granulocyte percent 66.4  37 - 80 %G   RBC 3.9 (*) 4.69 - 6.13 M/uL   Hemoglobin 11.5 (*) 14.1 - 18.1 g/dL   HCT, POC 16.1 (*) 09.6 - 53.7 %   MCV 87.3  80 - 97 fL   MCH, POC 29.5  27 - 31.2 pg   MCHC 33.8  31.8 - 35.4 g/dL   RDW, POC 04.5     Platelet Count, POC 261.0  142 - 424 K/uL   MPV 7.2  0 - 99.8 fL   Return in about 2 months (around 07/23/2013) for Recheck medical problems.  Emberli Ballester P. Modesto Charon, M.D.

## 2013-05-24 LAB — BMP8+EGFR
BUN/Creatinine Ratio: 12 (ref 10–22)
BUN: 20 mg/dL (ref 8–27)
CO2: 21 mmol/L (ref 18–29)
Calcium: 9.7 mg/dL (ref 8.6–10.2)
Chloride: 96 mmol/L — ABNORMAL LOW (ref 97–108)
Creatinine, Ser: 1.67 mg/dL — ABNORMAL HIGH (ref 0.76–1.27)
GFR calc Af Amer: 43 mL/min/{1.73_m2} — ABNORMAL LOW (ref >59)
GFR calc non Af Amer: 38 mL/min/{1.73_m2} — ABNORMAL LOW (ref >59)
Glucose: 87 mg/dL (ref 65–99)
Potassium: 4.1 mmol/L (ref 3.5–5.2)
Sodium: 135 mmol/L (ref 134–144)

## 2013-07-31 ENCOUNTER — Encounter (INDEPENDENT_AMBULATORY_CARE_PROVIDER_SITE_OTHER): Payer: Self-pay

## 2013-07-31 ENCOUNTER — Encounter: Payer: Self-pay | Admitting: Family Medicine

## 2013-07-31 ENCOUNTER — Ambulatory Visit (INDEPENDENT_AMBULATORY_CARE_PROVIDER_SITE_OTHER): Payer: Medicare Other | Admitting: Family Medicine

## 2013-07-31 VITALS — BP 114/67 | HR 68 | Temp 97.4°F | Ht 65.0 in | Wt 130.0 lb

## 2013-07-31 DIAGNOSIS — E039 Hypothyroidism, unspecified: Secondary | ICD-10-CM

## 2013-07-31 DIAGNOSIS — R634 Abnormal weight loss: Secondary | ICD-10-CM

## 2013-07-31 DIAGNOSIS — F0280 Dementia in other diseases classified elsewhere without behavioral disturbance: Secondary | ICD-10-CM

## 2013-07-31 DIAGNOSIS — G4700 Insomnia, unspecified: Secondary | ICD-10-CM

## 2013-07-31 DIAGNOSIS — R630 Anorexia: Secondary | ICD-10-CM

## 2013-07-31 DIAGNOSIS — H9193 Unspecified hearing loss, bilateral: Secondary | ICD-10-CM

## 2013-07-31 DIAGNOSIS — G309 Alzheimer's disease, unspecified: Secondary | ICD-10-CM

## 2013-07-31 DIAGNOSIS — S73004S Unspecified dislocation of right hip, sequela: Secondary | ICD-10-CM

## 2013-07-31 DIAGNOSIS — F329 Major depressive disorder, single episode, unspecified: Secondary | ICD-10-CM

## 2013-07-31 DIAGNOSIS — H9190 Unspecified hearing loss, unspecified ear: Secondary | ICD-10-CM

## 2013-07-31 DIAGNOSIS — D492 Neoplasm of unspecified behavior of bone, soft tissue, and skin: Secondary | ICD-10-CM

## 2013-07-31 DIAGNOSIS — F32A Depression, unspecified: Secondary | ICD-10-CM

## 2013-07-31 DIAGNOSIS — F3289 Other specified depressive episodes: Secondary | ICD-10-CM

## 2013-07-31 NOTE — Progress Notes (Signed)
Patient ID: Jeremy Bailey, male   DOB: [birth year], 79 y.o.   MRN: [id number] SUBJECTIVE: CC: Chief Complaint  Patient presents with  . Follow-up    injury from fall   and his  appetite  has decreased    HPI:  Came for follow up of his weight loss and advanced dementia. His memory fluctuates. He continues with a slow weight loss. Family again confirms that it is comfort care. His apetite is poor. He is more sleepy these days. The insurance did not pay for cyproheptadine but pays for megace. Is more cold intolerant.. The wife tries to give him one to two cans of ensure to supplement.  Depression: stable.  The right hip doesn't bother him anymore.    Past Medical History  Diagnosis Date  . Alzheimer disease   . Thyroid disease   . Depression   . Insomnia   . Hypothyroid   . Insomnia   . Hard of hearing    Past Surgical History  Procedure Laterality Date  . Appendectomy     History   Social History  . Marital Status: Married    Spouse Name: N/A    Number of Children: N/A  . Years of Education: N/A   Occupational History  . Not on file.   Social History Main Topics  . Smoking status: Former Smoker -- 1.50 packs/day for 33 years    Start date: 08/14/1945    Quit date: 08/14/1978  . Smokeless tobacco: Not on file  . Alcohol Use: Not on file  . Drug Use: Not on file  . Sexual Activity: Not on file   Other Topics Concern  . Not on file   Social History Narrative  . No narrative on file   No family history on file. Current Outpatient Prescriptions on File Prior to Visit  Medication Sig Dispense Refill  . acetaminophen (TYLENOL) 325 MG tablet Take by mouth as needed.      . citalopram (CELEXA) 20 MG tablet Take 1 tablet (20 mg total) by mouth daily.  90 tablet  3  . donepezil (ARICEPT) 10 MG tablet Take 1 tablet (10 mg total) by mouth at bedtime.  90 tablet  3  . levothyroxine (SYNTHROID, LEVOTHROID) 50 MCG tablet Take 1 tablet (50 mcg total) by mouth daily.  90 tablet   3  . memantine (NAMENDA) 10 MG tablet Take 1 tablet (10 mg total) by mouth 2 (two) times daily.  180 tablet  5  . acetaminophen-codeine (TYLENOL #3) 300-30 MG per tablet Take 1 tablet by mouth at bedtime.  30 tablet  0  . Multiple Vitamin (MULTIVITAMIN) tablet Take 1 tablet by mouth daily.       No current facility-administered medications on file prior to visit.   No Known Allergies Immunization History  Administered Date(s) Administered  . Influenza Whole 04/26/2010  . Influenza,inj,Quad PF,36+ Mos 05/21/2013   Prior to Admission medications   Medication Sig Start Date End Date Taking? Authorizing Provider  acetaminophen (TYLENOL) 325 MG tablet Take by mouth as needed.   Yes Historical Provider, MD  citalopram (CELEXA) 20 MG tablet Take 1 tablet (20 mg total) by mouth daily. 02/17/13  Yes Ileana Ladd, MD  cyproheptadine (PERIACTIN) 4 MG tablet Take 1 tablet (4 mg total) by mouth 3 (three) times daily as needed. 03/21/13  Yes Ileana Ladd, MD  donepezil (ARICEPT) 10 MG tablet Take 1 tablet (10 mg total) by mouth at bedtime. 02/17/13  Yes Ileana Ladd,  MD  Fish Oil-Cholecalciferol (FISH OIL + D3 PO) Take by mouth.   Yes Historical Provider, MD  levothyroxine (SYNTHROID, LEVOTHROID) 50 MCG tablet Take 1 tablet (50 mcg total) by mouth daily. 02/17/13  Yes Ileana Ladd, MD  MEGESTROL ACETATE PO Take by mouth.   Yes Historical Provider, MD  memantine (NAMENDA) 10 MG tablet Take 1 tablet (10 mg total) by mouth 2 (two) times daily. 03/21/13  Yes Ileana Ladd, MD  traZODone (DESYREL) 50 MG tablet Take 1 tablet (50 mg total) by mouth at bedtime. 02/17/13  Yes Ileana Ladd, MD  acetaminophen-codeine (TYLENOL #3) 300-30 MG per tablet Take 1 tablet by mouth at bedtime. 05/23/13   Ileana Ladd, MD  Multiple Vitamin (MULTIVITAMIN) tablet Take 1 tablet by mouth daily.    Historical Provider, MD     ROS: As above in the HPI. All other systems are stable or negative.  OBJECTIVE: APPEARANCE:   Patient in no acute distress.The patient appeared well nourished and normally developed. Acyanotic. Waist: VITAL SIGNS:BP 114/67  Pulse 68  Temp(Src) 97.4 F (36.3 C) (Oral)  Ht 5\' 5"  (1.651 m)  Wt 130 lb (58.968 kg)  BMI 21.63 kg/m2 Elderly WM weight loss as per review of synopsis  Pleasant. Hard of hearing  SKIN: warm and  Dry without overt rashes, tattoos and scars  HEAD and Neck: without JVD, Head and scalp: normal Eyes:No scleral icterus. Fundi normal, eye movements normal. Ears: Auricle normal, canal normal, Tympanic membranes normal, insufflation normal. Nose: normal Throat: normal Neck & thyroid: normal  CHEST & LUNGS: Chest wall: normal Lungs: Clear  CVS: Reveals the PMI to be normally located. Regular rhythm, First and Second Heart sounds are normal,  absence of murmurs, rubs or gallops. Peripheral vasculature: Radial pulses: normal Dorsal pedis pulses: normal Posterior pulses: normal  ABDOMEN:  Appearance: normal Benign, no organomegaly, no masses, no Abdominal Aortic enlargement. No Guarding , no rebound. No Bruits. Bowel sounds: normal  RECTAL: N/A GU: N/A  EXTREMETIES: nonedematous.  MUSCULOSKELETAL:  Spine: normal Joints: hips Reduced ROM  NEUROLOGIC: oriented to  person; nonfocal.   ASSESSMENT:  Alzheimer disease  Depression  Hypothyroid - Plan: TSH  Insomnia  Neoplasm of skin of face  Anorexia  Weight loss - Plan: BMP8+EGFR  Hip dislocation, right, sequela  Hard of hearing, bilateral  Progression of his alzheimer's disease with FTT and weight loss. Family aware.   PLAN: Will streamline medications and reduce side effects as much as possible.  Continue present level of care. Orders Placed This Encounter  Procedures  . BMP8+EGFR  . TSH   Meds ordered this encounter  Medications  . MEGESTROL ACETATE PO    Sig: Take by mouth.  . Fish Oil-Cholecalciferol (FISH OIL + D3 PO)    Sig: Take by mouth.   Medications  Discontinued During This Encounter  Medication Reason  . traZODone (DESYREL) 50 MG tablet Side effect (s)  . cyproheptadine (PERIACTIN) 4 MG tablet Formulary change   Return in about 2 months (around 10/01/2013) for Recheck medical problems.  Jaiden Dinkins P. [place] Charon, M.D.

## 2013-08-01 ENCOUNTER — Ambulatory Visit: Payer: Medicare Other

## 2013-08-01 LAB — BMP8+EGFR
BUN/Creatinine Ratio: 13 (ref 10–22)
BUN: 25 mg/dL (ref 8–27)
CO2: 26 mmol/L (ref 18–29)
Calcium: 9.7 mg/dL (ref 8.6–10.2)
Chloride: 101 mmol/L (ref 97–108)
Creatinine, Ser: 1.87 mg/dL — ABNORMAL HIGH (ref 0.76–1.27)
GFR calc Af Amer: 38 mL/min/{1.73_m2} — ABNORMAL LOW (ref >59)
GFR calc non Af Amer: 33 mL/min/{1.73_m2} — ABNORMAL LOW (ref >59)
Glucose: 80 mg/dL (ref 65–99)
Potassium: 4.2 mmol/L (ref 3.5–5.2)
Sodium: 144 mmol/L (ref 134–144)

## 2013-08-01 LAB — TSH: TSH: 2.43 u[IU]/mL (ref 0.450–4.500)

## 2013-08-11 ENCOUNTER — Telehealth: Payer: Self-pay | Admitting: *Deleted

## 2013-08-11 NOTE — Telephone Encounter (Signed)
Pt was admitted to [facility] today, nurse is concerned with a severe med interaction, Amiodarone and Celexa, pls, notify nurse and patient of any orders.Pt has been taking these 2 together since 08/05/13

## 2013-08-15 NOTE — Telephone Encounter (Signed)
Was sent to the [facility] yesterday

## 2013-08-19 ENCOUNTER — Telehealth: Payer: Self-pay | Admitting: Family Medicine

## 2013-08-19 ENCOUNTER — Telehealth: Payer: Self-pay | Admitting: *Deleted

## 2013-08-19 NOTE — Telephone Encounter (Signed)
Patient is not sleeping, very arguative, combative and then very apologetic. Daughter can get him calm downed for a little and then he gets upset again and is raging. First available appointment with you is Monday please advise

## 2013-08-19 NOTE — Telephone Encounter (Signed)
appt 1/12 with wong

## 2013-08-20 NOTE — Telephone Encounter (Signed)
Patient aware very upset said they will find another doctor will not be coming back to see you.

## 2013-08-20 NOTE — Telephone Encounter (Signed)
Family needs to consider placement in a Nursing Home. This is difficult  To treat. Needs constant supervision to avoid injuries. Medications have  Side effects and poor response. Merrily Tegeler P. Jacelyn Grip, M.D.

## 2013-08-20 NOTE — Telephone Encounter (Signed)
Family needs to consider placement.difficult to treat  This. Will need constant supervision to avoid injury. Brees Hounshell P. Jacelyn Grip, M.D.

## 2013-08-25 ENCOUNTER — Ambulatory Visit: Payer: Medicare Other | Admitting: Family Medicine

## 2013-09-13 DIAGNOSIS — R32 Unspecified urinary incontinence: Secondary | ICD-10-CM

## 2013-09-13 DIAGNOSIS — F0390 Unspecified dementia without behavioral disturbance: Secondary | ICD-10-CM

## 2013-09-13 DIAGNOSIS — R569 Unspecified convulsions: Secondary | ICD-10-CM

## 2013-09-13 DIAGNOSIS — H9190 Unspecified hearing loss, unspecified ear: Secondary | ICD-10-CM

## 2013-09-19 ENCOUNTER — Other Ambulatory Visit: Payer: Self-pay

## 2013-10-16 ENCOUNTER — Other Ambulatory Visit: Payer: Self-pay

## 2013-10-16 DIAGNOSIS — G309 Alzheimer's disease, unspecified: Secondary | ICD-10-CM

## 2013-10-16 DIAGNOSIS — F0280 Dementia in other diseases classified elsewhere without behavioral disturbance: Secondary | ICD-10-CM

## 2013-10-16 DIAGNOSIS — E039 Hypothyroidism, unspecified: Secondary | ICD-10-CM

## 2013-10-16 DIAGNOSIS — F329 Major depressive disorder, single episode, unspecified: Secondary | ICD-10-CM

## 2013-10-16 DIAGNOSIS — F32A Depression, unspecified: Secondary | ICD-10-CM

## 2013-10-16 MED ORDER — MEMANTINE HCL 10 MG PO TABS: 10.0000 mg | ORAL_TABLET | Freq: Two times a day (BID) | ORAL | Status: AC

## 2013-10-16 MED ORDER — CITALOPRAM HYDROBROMIDE 20 MG PO TABS: 20.0000 mg | ORAL_TABLET | Freq: Every day | ORAL | Status: AC

## 2013-10-16 MED ORDER — LEVOTHYROXINE SODIUM 50 MCG PO TABS: 50.0000 ug | ORAL_TABLET | Freq: Every day | ORAL | Status: AC

## 2014-01-12 DEATH — deceased

## 2014-06-13 IMAGING — CT CT HIP*R* W/O CM
3 of 5 series · 14 of 33 positions shown, 16 images · non-contrast
Comparison: None.

CLINICAL DATA: Right hip dislocation. Impacted right hip fracture.

EXAM:
CT OF THE RIGHT HIP WITHOUT CONTRAST
TECHNIQUE: Multidetector CT imaging was performed according to the standard
protocol. Multiplanar CT image reconstructions were also generated.

[Series 5: mpr hip sag bone 2.0 · sagittal · 0.34mm/px · 5 of 92 slices shown]
[im 16/92  bone]
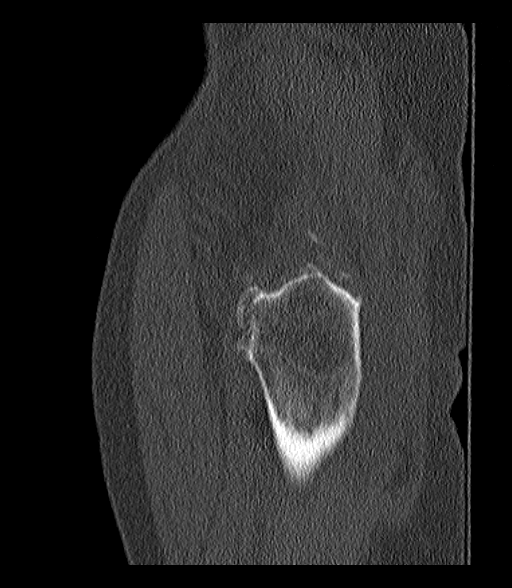
[im 31/92  bone]
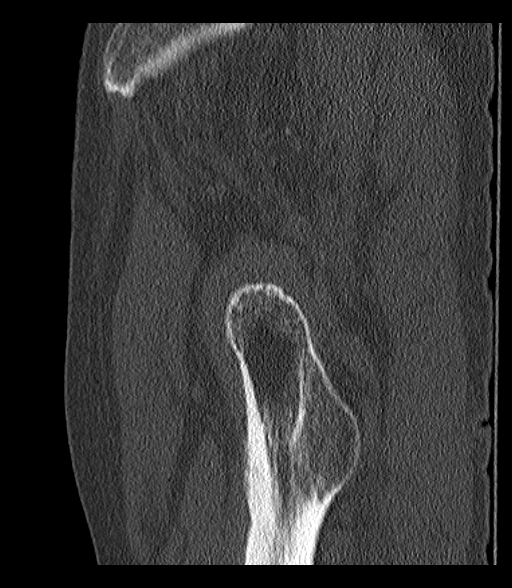
[im 46/92  bone]
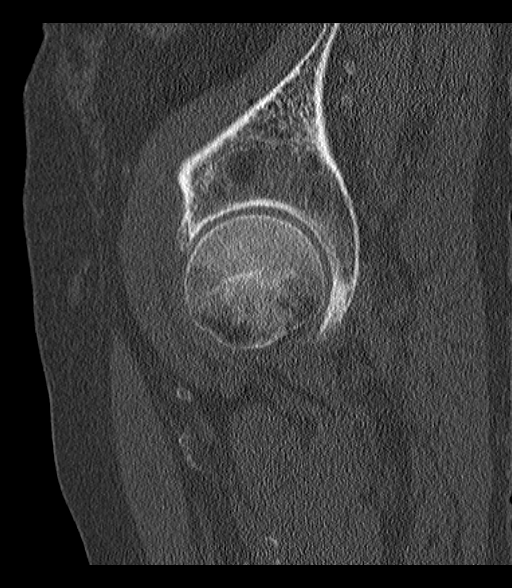
[im 61/92  bone]
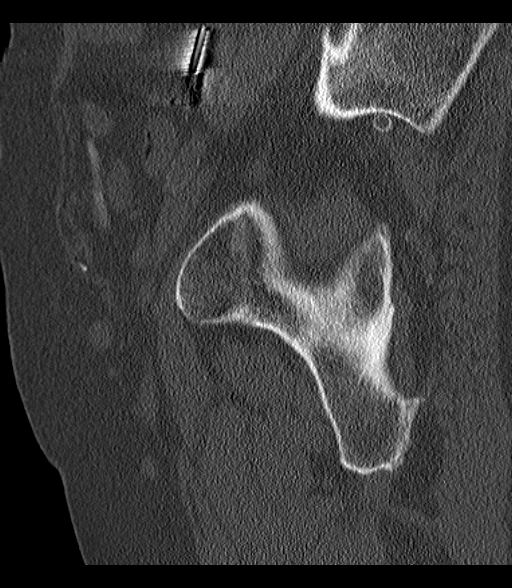
[im 76/92  bone]
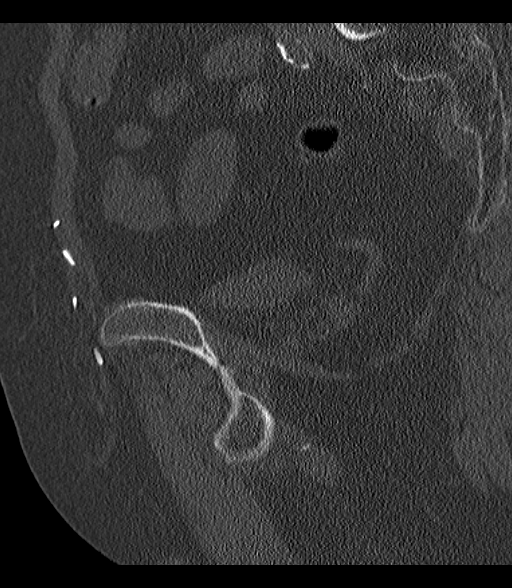

[Series 6: hip axial st 2.0 · axial · 0.30mm/px · z∈[-289,-135]mm · 8 of 91 slices shown, 10 images]
[im 7/91  soft-tissue]
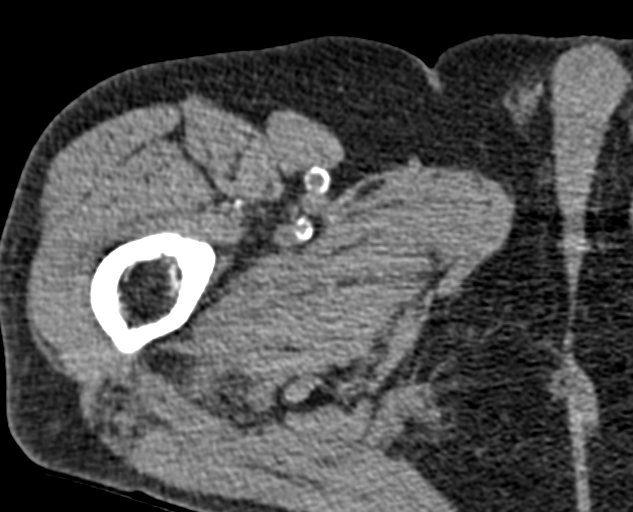
[im 7/91  bone]
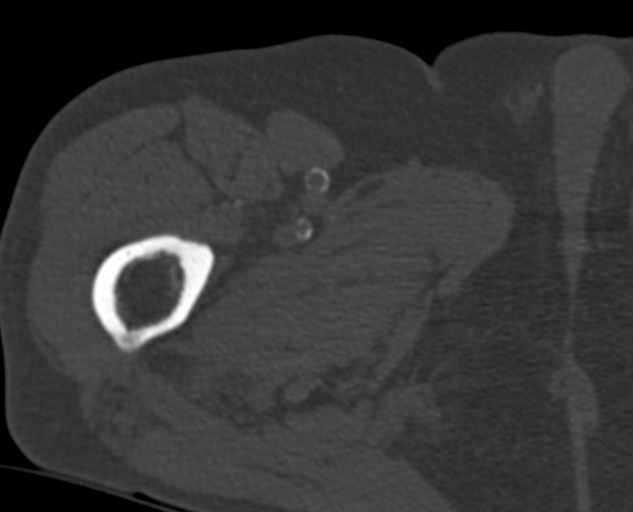
[im 21/91  bone]
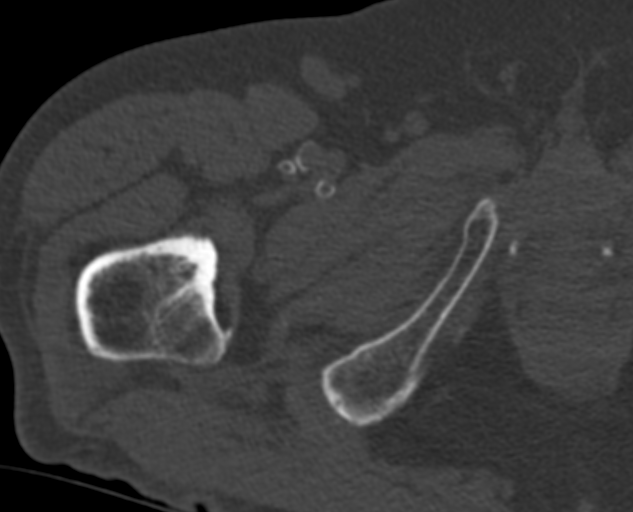
[im 28/91  bone]
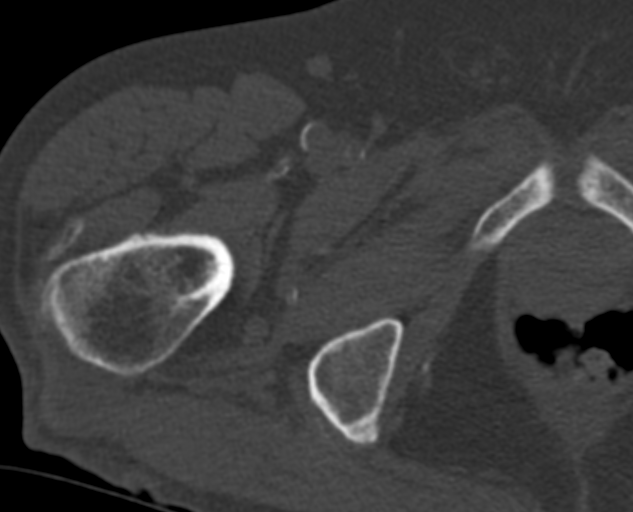
[im 42/91  bone]
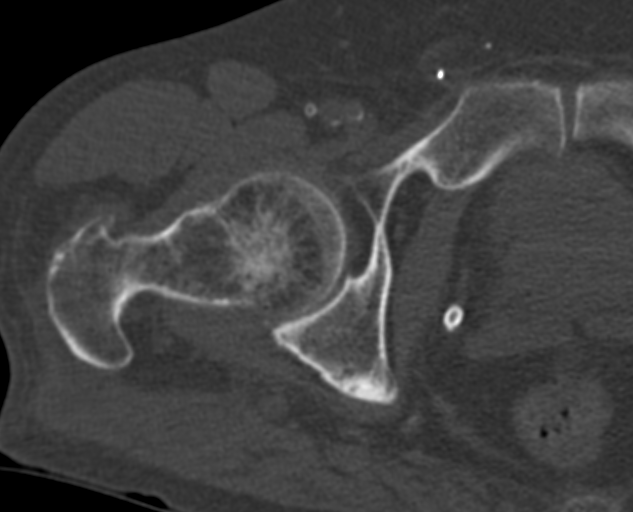
[im 49/91  soft-tissue]
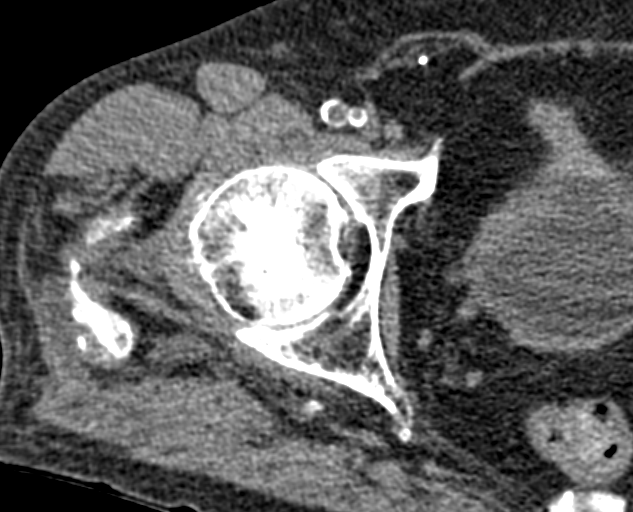
[im 49/91  bone]
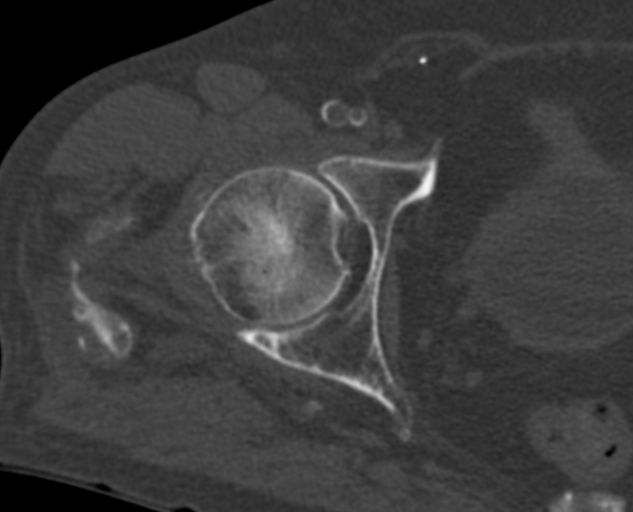
[im 63/91  bone]
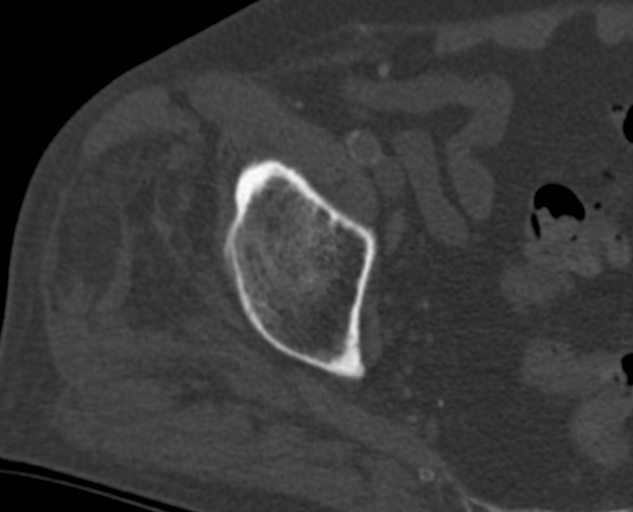
[im 70/91  bone]
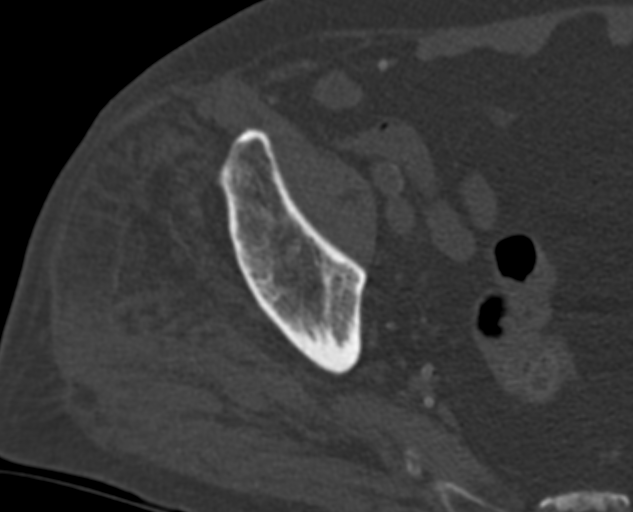
[im 84/91  bone]
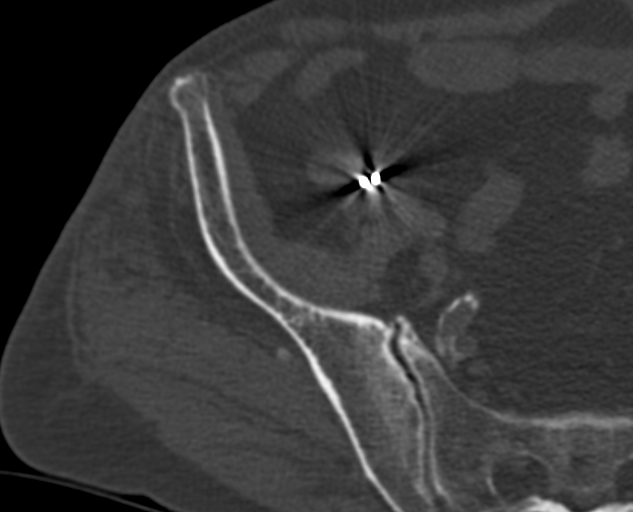

[Series 7: hip coronal st 2.0 · coronal · 0.36mm/px · 1 of 84 slices shown]
[im 42/84  bone]
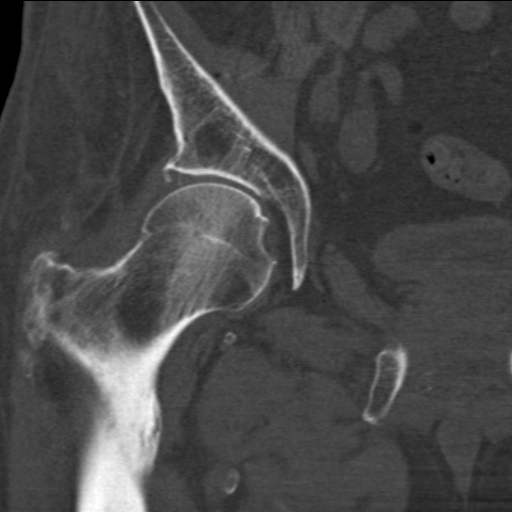

[14 of 33 positions shown; findings below may reference images not displayed]

FINDINGS: There is no right hip effusion. The right acetabulum is intact.
Greater trochanter is intact. Atherosclerosis is present. Gluteus
minimus muscular atrophy is present. No discrete tendon tear.
Visualized right obturator ring appears intact. Right SI joint
degenerative disease. Surgical clips are present in the anatomic
pelvis. The superficial soft tissues around the right hip appear
within normal limits.
IMPRESSION: Negative for hip fracture or dislocation. Mild right hip
osteoarthritis. There is no hip effusion to suggest recent trauma.
The acetabulum is also intact, which is commonly associated with hip
dislocation.
